# Patient Record
Sex: Male | Born: 1960 | Race: Black or African American | Hispanic: No | Marital: Married | Smoking: Current every day smoker
Health system: Southern US, Community
[De-identification: ages and names within clinical notes are randomized; demographics above are authoritative.]

## PROBLEM LIST (undated history)

## (undated) DIAGNOSIS — F329 Major depressive disorder, single episode, unspecified: Secondary | ICD-10-CM

## (undated) DIAGNOSIS — J45909 Unspecified asthma, uncomplicated: Secondary | ICD-10-CM

## (undated) DIAGNOSIS — I10 Essential (primary) hypertension: Secondary | ICD-10-CM

## (undated) DIAGNOSIS — M549 Dorsalgia, unspecified: Secondary | ICD-10-CM

## (undated) DIAGNOSIS — F32A Depression, unspecified: Secondary | ICD-10-CM

## (undated) HISTORY — PX: HAND TENDON SURGERY: SHX663

---

## 2009-09-17 ENCOUNTER — Encounter
Admission: RE | Admit: 2009-09-17 | Discharge: 2009-12-10 | Payer: Self-pay | Admitting: Physical Medicine & Rehabilitation

## 2009-11-19 ENCOUNTER — Ambulatory Visit: Payer: Self-pay | Admitting: Physical Medicine and Rehabilitation

## 2009-12-10 ENCOUNTER — Encounter
Admission: RE | Admit: 2009-12-10 | Discharge: 2010-03-10 | Payer: Self-pay | Admitting: Physical Medicine and Rehabilitation

## 2009-12-17 ENCOUNTER — Emergency Department (HOSPITAL_COMMUNITY): Admission: EM | Admit: 2009-12-17 | Discharge: 2009-12-17 | Payer: Self-pay | Admitting: Emergency Medicine

## 2009-12-17 ENCOUNTER — Ambulatory Visit (HOSPITAL_COMMUNITY)
Admission: RE | Admit: 2009-12-17 | Discharge: 2009-12-17 | Payer: Self-pay | Admitting: Physical Medicine and Rehabilitation

## 2009-12-24 ENCOUNTER — Ambulatory Visit: Payer: Self-pay | Admitting: Physical Medicine and Rehabilitation

## 2010-07-15 LAB — GC/CHLAMYDIA PROBE AMP, GENITAL
Chlamydia, DNA Probe: NEGATIVE
GC Probe Amp, Genital: NEGATIVE

## 2010-08-15 ENCOUNTER — Emergency Department (HOSPITAL_COMMUNITY)
Admission: EM | Admit: 2010-08-15 | Discharge: 2010-08-15 | Disposition: A | Discharge status: Home / Self Care | Payer: Medicaid Other | Attending: Emergency Medicine | Admitting: Emergency Medicine

## 2010-08-15 DIAGNOSIS — M549 Dorsalgia, unspecified: Secondary | ICD-10-CM | POA: Insufficient documentation

## 2010-08-15 DIAGNOSIS — M79609 Pain in unspecified limb: Secondary | ICD-10-CM | POA: Insufficient documentation

## 2010-08-15 DIAGNOSIS — G8929 Other chronic pain: Secondary | ICD-10-CM | POA: Insufficient documentation

## 2010-08-26 ENCOUNTER — Emergency Department (HOSPITAL_COMMUNITY)
Admission: EM | Admit: 2010-08-26 | Discharge: 2010-08-26 | Disposition: A | Discharge status: Home / Self Care | Payer: Medicaid Other | Attending: Emergency Medicine | Admitting: Emergency Medicine

## 2010-08-26 ENCOUNTER — Emergency Department (HOSPITAL_COMMUNITY): Payer: Medicaid Other

## 2010-08-26 DIAGNOSIS — M549 Dorsalgia, unspecified: Secondary | ICD-10-CM | POA: Insufficient documentation

## 2010-08-26 DIAGNOSIS — M79609 Pain in unspecified limb: Secondary | ICD-10-CM | POA: Insufficient documentation

## 2010-08-26 DIAGNOSIS — Z79899 Other long term (current) drug therapy: Secondary | ICD-10-CM | POA: Insufficient documentation

## 2010-08-26 DIAGNOSIS — M25649 Stiffness of unspecified hand, not elsewhere classified: Secondary | ICD-10-CM | POA: Insufficient documentation

## 2010-08-26 DIAGNOSIS — G8929 Other chronic pain: Secondary | ICD-10-CM | POA: Insufficient documentation

## 2012-02-13 ENCOUNTER — Emergency Department (HOSPITAL_COMMUNITY)
Admission: EM | Admit: 2012-02-13 | Discharge: 2012-02-13 | Disposition: A | Discharge status: Home / Self Care | Payer: Medicaid Other | Attending: Emergency Medicine | Admitting: Emergency Medicine

## 2012-02-13 ENCOUNTER — Encounter (HOSPITAL_COMMUNITY): Payer: Self-pay

## 2012-02-13 DIAGNOSIS — Z760 Encounter for issue of repeat prescription: Secondary | ICD-10-CM

## 2012-02-13 DIAGNOSIS — F17200 Nicotine dependence, unspecified, uncomplicated: Secondary | ICD-10-CM | POA: Insufficient documentation

## 2012-02-13 DIAGNOSIS — F329 Major depressive disorder, single episode, unspecified: Secondary | ICD-10-CM | POA: Insufficient documentation

## 2012-02-13 DIAGNOSIS — F3289 Other specified depressive episodes: Secondary | ICD-10-CM | POA: Insufficient documentation

## 2012-02-13 DIAGNOSIS — J45909 Unspecified asthma, uncomplicated: Secondary | ICD-10-CM | POA: Insufficient documentation

## 2012-02-13 DIAGNOSIS — I10 Essential (primary) hypertension: Secondary | ICD-10-CM | POA: Insufficient documentation

## 2012-02-13 DIAGNOSIS — G8929 Other chronic pain: Secondary | ICD-10-CM | POA: Insufficient documentation

## 2012-02-13 HISTORY — DX: Dorsalgia, unspecified: M54.9

## 2012-02-13 HISTORY — DX: Depression, unspecified: F32.A

## 2012-02-13 HISTORY — DX: Unspecified asthma, uncomplicated: J45.909

## 2012-02-13 HISTORY — DX: Major depressive disorder, single episode, unspecified: F32.9

## 2012-02-13 HISTORY — DX: Essential (primary) hypertension: I10

## 2012-02-13 MED ORDER — OXYCODONE-ACETAMINOPHEN 5-325 MG PO TABS
1.0000 | ORAL_TABLET | Freq: Once | ORAL | Status: AC
Start: 2012-02-13 — End: 2012-02-13
  Administered 2012-02-13: 1 via ORAL
  Filled 2012-02-13: qty 1

## 2012-02-13 MED ORDER — AMLODIPINE BESYLATE 5 MG PO TABS: 5.0000 mg | ORAL_TABLET | Freq: Every day | ORAL | Status: DC
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
## 2012-02-13 NOTE — ED Provider Notes (Signed)
History     CSN: [id number]  Arrival date & time 02/13/12  1406   First MD Initiated Contact with Patient 02/13/12 1439      Chief Complaint  Patient presents with  . Medication Refill    (Consider location/radiation/quality/duration/timing/severity/associated sxs/prior treatment) HPI Comments: Patient presents requesting refill of chronic pain medication and HTM medication. Patient states that he recently moved from [place] and is out of these medications. He states that he has not seen a doctor since moving back. He was on percocet but cannot remember HTN meds. No vision change, stroke sx, chest pain, SOB. Upper extremity pain at baseline. Review of [place] substance reporting database shows RX 30 Percocet by Dr. Concepcion Elk < 30 days ago. When asked about this, patient then states he has seen Dr. Concepcion Elk last month and prior to this. He states he is unable to see Dr. Ginette Otto until the end of the months and asks for #20 Percocet 'to get him through'. No other complaints.   The history is provided by the patient.    Past Medical History  Diagnosis Date  . Hypertension   . Depression   . Back pain   . Asthma     Past Surgical History  Procedure Date  . Hand tendon surgery     No family history on file.  History  Substance Use Topics  . Smoking status: Current Every Day Smoker  . Smokeless tobacco: Not on file  . Alcohol Use: No      Review of Systems  Constitutional: Negative for fever.  HENT: Negative for sore throat and rhinorrhea.   Eyes: Negative for visual disturbance.  Respiratory: Negative for cough.   Cardiovascular: Negative for chest pain.  Gastrointestinal: Negative for nausea, vomiting, abdominal pain and diarrhea.  Genitourinary: Negative for dysuria.  Musculoskeletal: Positive for arthralgias. Negative for myalgias.  Skin: Negative for rash.  Neurological: Negative for weakness, numbness and headaches.    Allergies  Nsaids  Home Medications    Current Outpatient Rx  Name Route Sig Dispense Refill  . PRESCRIPTION MEDICATION Oral Take 1 tablet by mouth daily. Pt's on a blood pressure medication. Pt doesn't know the name of this medication. Pt has not taken in the last 2 months    . QUETIAPINE FUMARATE 300 MG PO TABS Oral Take 300 mg by mouth at bedtime.      BP 138/88  Pulse 73  Temp 98.5 F (36.9 C) (Oral)  Resp 18  Ht 5\' 7"  (1.702 m)  Wt 155 lb (70.308 kg)  BMI 24.28 kg/m2  SpO2 100%  Physical Exam  Nursing note and vitals reviewed. Constitutional: He appears well-developed and well-nourished.  HENT:  Head: Normocephalic and atraumatic.  Eyes: Conjunctivae normal are normal. Right eye exhibits no discharge. Left eye exhibits no discharge.  Neck: Normal range of motion. Neck supple.  Cardiovascular: Normal rate, regular rhythm and normal heart sounds.   Pulmonary/Chest: Effort normal and breath sounds normal.  Abdominal: Soft. There is no tenderness.  Musculoskeletal:       Healed surgical scars R wrist.   Neurological: He is alert.  Skin: Skin is warm and dry.  Psychiatric: He has a normal mood and affect.    ED Course  Procedures (including critical care time)  Labs Reviewed - No data to display No results found.   1. Chronic pain   2. Medication refill     2:53 PM Patient seen and examined. Will give pain medicine here. Discussed that chronic pain  cannot be managed in ED and deferred patient to PCP. Will start patient on low dose amlodipine. Patient urged to f/u with PCP as planned.   Vital signs reviewed and are as follows: Filed Vitals:   02/13/12 1426  BP: 138/88  Pulse: 73  Temp: 98.5 F (36.9 C)  Resp: 18      MDM  Chronic pain - one dose here. Patient needs to follow-up with his PCP for management of chronic pain. Patient was not immediately forthcoming about pain medications received from Dr. Concepcion Elk last month.   HTN - patient does not know what he has been taking. Will start on  5mg  amlodipine and allow PCP to adjust as desired.         Renne Crigler, [place] 02/13/12 [phone]

## 2012-02-15 NOTE — ED Provider Notes (Signed)
Medical screening examination/treatment/procedure(s) were performed by non-physician practitioner and as supervising physician I was immediately available for consultation/collaboration.  Casmira Cramer R. Tyshon Fanning, MD 02/15/12 2356 

## 2012-03-18 ENCOUNTER — Encounter (HOSPITAL_COMMUNITY): Payer: Self-pay | Admitting: Emergency Medicine

## 2012-03-18 ENCOUNTER — Emergency Department (HOSPITAL_COMMUNITY): Payer: Medicaid Other

## 2012-03-18 ENCOUNTER — Emergency Department (HOSPITAL_COMMUNITY)
Admission: EM | Admit: 2012-03-18 | Discharge: 2012-03-18 | Disposition: A | Discharge status: Home / Self Care | Payer: Medicaid Other | Attending: Emergency Medicine | Admitting: Emergency Medicine

## 2012-03-18 DIAGNOSIS — F17200 Nicotine dependence, unspecified, uncomplicated: Secondary | ICD-10-CM | POA: Insufficient documentation

## 2012-03-18 DIAGNOSIS — I10 Essential (primary) hypertension: Secondary | ICD-10-CM | POA: Insufficient documentation

## 2012-03-18 DIAGNOSIS — Z8739 Personal history of other diseases of the musculoskeletal system and connective tissue: Secondary | ICD-10-CM | POA: Insufficient documentation

## 2012-03-18 DIAGNOSIS — F329 Major depressive disorder, single episode, unspecified: Secondary | ICD-10-CM | POA: Insufficient documentation

## 2012-03-18 DIAGNOSIS — Z79899 Other long term (current) drug therapy: Secondary | ICD-10-CM | POA: Insufficient documentation

## 2012-03-18 DIAGNOSIS — Z23 Encounter for immunization: Secondary | ICD-10-CM | POA: Insufficient documentation

## 2012-03-18 DIAGNOSIS — Y929 Unspecified place or not applicable: Secondary | ICD-10-CM | POA: Insufficient documentation

## 2012-03-18 DIAGNOSIS — S0180XA Unspecified open wound of other part of head, initial encounter: Secondary | ICD-10-CM | POA: Insufficient documentation

## 2012-03-18 DIAGNOSIS — Y939 Activity, unspecified: Secondary | ICD-10-CM | POA: Insufficient documentation

## 2012-03-18 DIAGNOSIS — J45909 Unspecified asthma, uncomplicated: Secondary | ICD-10-CM | POA: Insufficient documentation

## 2012-03-18 DIAGNOSIS — IMO0002 Reserved for concepts with insufficient information to code with codable children: Secondary | ICD-10-CM | POA: Insufficient documentation

## 2012-03-18 DIAGNOSIS — F3289 Other specified depressive episodes: Secondary | ICD-10-CM | POA: Insufficient documentation

## 2012-03-18 DIAGNOSIS — S0181XA Laceration without foreign body of other part of head, initial encounter: Secondary | ICD-10-CM

## 2012-03-18 MED ORDER — TETANUS-DIPHTH-ACELL PERTUSSIS 5-2.5-18.5 LF-MCG/0.5 IM SUSP
0.5000 mL | Freq: Once | INTRAMUSCULAR | Status: AC
  Administered 2012-03-18: 0.5 mL via INTRAMUSCULAR
  Filled 2012-03-18: qty 0.5

## 2012-03-18 MED ORDER — HYDROCODONE-ACETAMINOPHEN 5-325 MG PO TABS: 1.0000 | ORAL_TABLET | Freq: Four times a day (QID) | ORAL | Status: DC | PRN

## 2012-03-18 MED ORDER — HYDROCODONE-ACETAMINOPHEN 5-325 MG PO TABS
1.0000 | ORAL_TABLET | Freq: Once | ORAL | Status: AC
  Administered 2012-03-18: 1 via ORAL
  Filled 2012-03-18: qty 1
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
## 2012-03-18 NOTE — ED Provider Notes (Signed)
Medical screening examination/treatment/procedure(s) were performed by non-physician practitioner and as supervising physician I was immediately available for consultation/collaboration.   Celene Kras, MD 03/18/12 [phone]
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
## 2012-04-02 ENCOUNTER — Encounter (HOSPITAL_COMMUNITY): Payer: Self-pay | Admitting: Neurology

## 2012-04-02 ENCOUNTER — Emergency Department (HOSPITAL_COMMUNITY)
Admission: EM | Admit: 2012-04-02 | Discharge: 2012-04-02 | Disposition: A | Discharge status: Home / Self Care | Payer: Medicaid Other | Attending: Emergency Medicine | Admitting: Emergency Medicine

## 2012-04-02 DIAGNOSIS — M79609 Pain in unspecified limb: Secondary | ICD-10-CM

## 2012-04-02 DIAGNOSIS — F17200 Nicotine dependence, unspecified, uncomplicated: Secondary | ICD-10-CM | POA: Insufficient documentation

## 2012-04-02 DIAGNOSIS — M79606 Pain in leg, unspecified: Secondary | ICD-10-CM

## 2012-04-02 DIAGNOSIS — F3289 Other specified depressive episodes: Secondary | ICD-10-CM | POA: Insufficient documentation

## 2012-04-02 DIAGNOSIS — R197 Diarrhea, unspecified: Secondary | ICD-10-CM | POA: Insufficient documentation

## 2012-04-02 DIAGNOSIS — R61 Generalized hyperhidrosis: Secondary | ICD-10-CM | POA: Insufficient documentation

## 2012-04-02 DIAGNOSIS — F329 Major depressive disorder, single episode, unspecified: Secondary | ICD-10-CM | POA: Insufficient documentation

## 2012-04-02 DIAGNOSIS — M25569 Pain in unspecified knee: Secondary | ICD-10-CM | POA: Insufficient documentation

## 2012-04-02 DIAGNOSIS — Z79899 Other long term (current) drug therapy: Secondary | ICD-10-CM | POA: Insufficient documentation

## 2012-04-02 DIAGNOSIS — J45909 Unspecified asthma, uncomplicated: Secondary | ICD-10-CM | POA: Insufficient documentation

## 2012-04-02 DIAGNOSIS — I10 Essential (primary) hypertension: Secondary | ICD-10-CM | POA: Insufficient documentation

## 2012-04-02 LAB — CBC WITH DIFFERENTIAL/PLATELET
Basophils Absolute: 0.1 10*3/uL (ref 0.0–0.1)
Eosinophils Absolute: 0.6 10*3/uL (ref 0.0–0.7)
Eosinophils Relative: 7 % — ABNORMAL HIGH (ref 0–5)
HCT: 45.9 % (ref 39.0–52.0)
Hemoglobin: 15.8 g/dL (ref 13.0–17.0)
MCH: 31 pg (ref 26.0–34.0)
MCHC: 34.4 g/dL (ref 30.0–36.0)
Monocytes Absolute: 0.6 10*3/uL (ref 0.1–1.0)
Monocytes Relative: 7 % (ref 3–12)
Neutro Abs: 3.5 10*3/uL (ref 1.7–7.7)
RBC: 5.1 MIL/uL (ref 4.22–5.81)
RDW: 14 % (ref 11.5–15.5)
WBC: 7.8 10*3/uL (ref 4.0–10.5)

## 2012-04-02 LAB — URINALYSIS, ROUTINE W REFLEX MICROSCOPIC
Bilirubin Urine: NEGATIVE
Leukocytes, UA: NEGATIVE
Protein, ur: NEGATIVE mg/dL

## 2012-04-02 LAB — BASIC METABOLIC PANEL
GFR calc non Af Amer: 85 mL/min — ABNORMAL LOW (ref >90)
Glucose, Bld: 96 mg/dL (ref 70–99)
Sodium: 138 mEq/L (ref 135–145)

## 2012-04-02 LAB — TROPONIN I: Troponin I: <0.3 ng/mL (ref <0.30)

## 2012-04-02 LAB — MAGNESIUM: Magnesium: 2.1 mg/dL (ref 1.5–2.5)

## 2012-04-02 MED ORDER — HYDROCODONE-ACETAMINOPHEN 5-325 MG PO TABS
1.0000 | ORAL_TABLET | Freq: Once | ORAL | Status: DC
  Filled 2012-04-02: qty 1

## 2012-04-02 MED ORDER — SODIUM CHLORIDE 0.9 % IV BOLUS (SEPSIS)
1000.0000 mL | Freq: Once | INTRAVENOUS | Status: AC
  Administered 2012-04-02: 1000 mL via INTRAVENOUS

## 2012-04-02 MED ORDER — OXYCODONE-ACETAMINOPHEN 5-325 MG PO TABS
2.0000 | ORAL_TABLET | Freq: Once | ORAL | Status: AC
  Administered 2012-04-02: 2 via ORAL
  Filled 2012-04-02: qty 2

## 2012-04-02 MED ORDER — HYDROCODONE-ACETAMINOPHEN 5-325 MG PO TABS: 2.0000 | ORAL_TABLET | ORAL | Status: DC | PRN

## 2012-04-02 MED ORDER — OXYCODONE-ACETAMINOPHEN 5-325 MG PO TABS: 2.0000 | ORAL_TABLET | Freq: Four times a day (QID) | ORAL | Status: DC | PRN

## 2012-04-02 NOTE — ED Notes (Signed)
Pt reporting can't sleep, breaking out in cold sweats x 1 week. Diarrhea no vomiting. Pt c/o right knee pain, has brace in place. Is out of percocet rx. Pt is ambulatory. Pt a x 4.

## 2012-04-02 NOTE — ED Notes (Signed)
Pt unable to provide urine sample at this time 
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
## 2012-04-02 NOTE — ED Notes (Signed)
Pt provided bus pass 

## 2012-04-02 NOTE — Progress Notes (Signed)
VASCULAR LAB PRELIMINARY  PRELIMINARY  PRELIMINARY  PRELIMINARY  Right lower extremity venous duplex completed.    Preliminary report:  Right:  No evidence of DVT, superficial thrombosis, or Baker's cyst.  Xavier [name], RVS 04/02/2012, 9:08 AM
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
## 2012-07-23 ENCOUNTER — Encounter (HOSPITAL_COMMUNITY): Payer: Self-pay | Admitting: Emergency Medicine

## 2012-07-23 ENCOUNTER — Emergency Department (HOSPITAL_COMMUNITY)
Admission: EM | Admit: 2012-07-23 | Discharge: 2012-07-23 | Disposition: A | Discharge status: Home / Self Care | Payer: Medicaid Other | Attending: Emergency Medicine | Admitting: Emergency Medicine

## 2012-07-23 DIAGNOSIS — F17200 Nicotine dependence, unspecified, uncomplicated: Secondary | ICD-10-CM | POA: Insufficient documentation

## 2012-07-23 DIAGNOSIS — Z23 Encounter for immunization: Secondary | ICD-10-CM | POA: Insufficient documentation

## 2012-07-23 DIAGNOSIS — IMO0002 Reserved for concepts with insufficient information to code with codable children: Secondary | ICD-10-CM | POA: Insufficient documentation

## 2012-07-23 DIAGNOSIS — J45909 Unspecified asthma, uncomplicated: Secondary | ICD-10-CM | POA: Insufficient documentation

## 2012-07-23 DIAGNOSIS — G8929 Other chronic pain: Secondary | ICD-10-CM | POA: Insufficient documentation

## 2012-07-23 DIAGNOSIS — Z79899 Other long term (current) drug therapy: Secondary | ICD-10-CM | POA: Insufficient documentation

## 2012-07-23 DIAGNOSIS — I10 Essential (primary) hypertension: Secondary | ICD-10-CM | POA: Insufficient documentation

## 2012-07-23 DIAGNOSIS — Z8659 Personal history of other mental and behavioral disorders: Secondary | ICD-10-CM | POA: Insufficient documentation

## 2012-07-23 DIAGNOSIS — Z760 Encounter for issue of repeat prescription: Secondary | ICD-10-CM | POA: Insufficient documentation

## 2012-07-23 DIAGNOSIS — Y939 Activity, unspecified: Secondary | ICD-10-CM | POA: Insufficient documentation

## 2012-07-23 DIAGNOSIS — W108XXA Fall (on) (from) other stairs and steps, initial encounter: Secondary | ICD-10-CM | POA: Insufficient documentation

## 2012-07-23 DIAGNOSIS — Y929 Unspecified place or not applicable: Secondary | ICD-10-CM | POA: Insufficient documentation

## 2012-07-23 DIAGNOSIS — M25569 Pain in unspecified knee: Secondary | ICD-10-CM | POA: Insufficient documentation

## 2012-07-23 DIAGNOSIS — Z8739 Personal history of other diseases of the musculoskeletal system and connective tissue: Secondary | ICD-10-CM | POA: Insufficient documentation

## 2012-07-23 MED ORDER — TETANUS-DIPHTH-ACELL PERTUSSIS 5-2.5-18.5 LF-MCG/0.5 IM SUSP
0.5000 mL | Freq: Once | INTRAMUSCULAR | Status: AC
  Administered 2012-07-23: 0.5 mL via INTRAMUSCULAR
  Filled 2012-07-23: qty 0.5

## 2012-07-23 MED ORDER — OXYCODONE-ACETAMINOPHEN 5-325 MG PO TABS
1.0000 | ORAL_TABLET | Freq: Once | ORAL | Status: AC
  Administered 2012-07-23: 1 via ORAL
  Filled 2012-07-23: qty 1

## 2012-07-23 NOTE — ED Provider Notes (Signed)
History     This chart was scribed for non-physician practitioner, Trixie Dredge working with Nelia Shi, MD by Smitty Pluck, ED scribe. This patient was seen in room TR07C/TR07C and the patient's care was started at 5:03 PM.  CSN: [id number]  Arrival date & time 07/23/12  1413      Chief Complaint  Patient presents with  . Medication Refill     The history is provided by the patient. No language interpreter was used.   Xavier [name] is a 52 y.o. male who presents to the Emergency Department requesting Seroquel and percocet. Pt mentions that he has not had his medications for the past 2 months. He states that he has constant, moderate, chronic left knee pain that is worse at night. Pain is rated at 9/10 currently. He states he has had the knee pain for years but he fell backwards last week down steps. He denies LOC. He states that he usually goes to pain management clinic for percocet but they could not see him until 2 weeks. He states the he saw Dr. Concepcion Elk recently and was not able to get pain medication because he is under contact with the pain clinic. Pt denies SI, HI, fever, chills, nausea, vomiting, diarrhea, weakness, cough, SOB and any other pain. He is unsure if tetanus is UTD.    Past Medical History  Diagnosis Date  . Hypertension   . Depression   . Back pain   . Asthma     Past Surgical History  Procedure Laterality Date  . Hand tendon surgery      History reviewed. No pertinent family history.  History  Substance Use Topics  . Smoking status: Current Every Day Smoker  . Smokeless tobacco: Not on file  . Alcohol Use: No      Review of Systems  Constitutional: Negative for fever and chills.  Respiratory: Negative for cough and shortness of breath.   Gastrointestinal: Negative for nausea, vomiting and diarrhea.  Musculoskeletal: Positive for arthralgias.  Neurological: Negative for weakness.    Allergies  Nsaids  Home Medications   Current  Outpatient Rx  Name  Route  Sig  Dispense  Refill  . albuterol (PROVENTIL HFA;VENTOLIN HFA) 108 (90 BASE) MCG/ACT inhaler   Inhalation   Inhale 2 puffs into the lungs every 6 (six) hours as needed for wheezing.         Marland Kitchen QUEtiapine (SEROQUEL) 300 MG tablet   Oral   Take 300 mg by mouth 2 (two) times daily.            BP 132/76  Pulse 78  Temp(Src) 98.1 F (36.7 C) (Oral)  Resp 16  SpO2 98%  Physical Exam  Nursing note and vitals reviewed. Constitutional: He appears well-developed and well-nourished. No distress.  HENT:  Head: Normocephalic.    Neck: Neck supple.  Pulmonary/Chest: Effort normal. No respiratory distress.  Musculoskeletal: Normal range of motion. He exhibits no edema.       Right knee: He exhibits normal range of motion, no swelling, no effusion, no ecchymosis, no deformity, no laceration, no LCL laxity and no MCL laxity.       Left knee: He exhibits normal range of motion, no swelling, no effusion, no ecchymosis, no deformity, normal alignment, no LCL laxity and no MCL laxity.  Distal sensation intact Distal pulses intact  Spine nontender without crepitus or stepoffs.   Neurological: He is alert. He has normal strength. No sensory deficit. GCS eye subscore is  4. GCS verbal subscore is 5. GCS motor subscore is 6.  Skin: Skin is warm and dry. He is not diaphoretic.    ED Course  Procedures (including critical care time) DIAGNOSTIC STUDIES: Oxygen Saturation is 98% on room air, normal by my interpretation.    COORDINATION OF CARE: 5:08 PM Discussed ED treatment with pt and pt agrees.     Labs Reviewed - No data to display No results found.   1. Medication refill   2. Chronic knee pain, right      MDM  Pt with chronic pain that is unchanged presented to ED requesting pain medication prescription, also prescription for seroquel which he states he has been off for 3-4 months.  I do not feel that it is appropriate for me to write these  prescriptions.  Pt has already been seen by his PCP who apparently did not write the prescriptions either.  Pt has follow up with pain management in a few weeks.  Knee exam is unremarkable.  No new or changed symptoms to chronic pain.  No need for imaging at this time.  Pt given 1 percocet PO and d/c home with resources, PCP and pain management follow up.   Small abrasion on face.  No neuro complaints, no gross deficits.  Fall was last week.  Tetanus updated.   I doubt any other EMC precluding discharge at this time including, but not necessarily limited to the following: septic knee, SI.     I personally performed the services described in this documentation, which was scribed in my presence. The recorded information has been reviewed and is accurate.        Trixie Dredge, PA-C 07/23/12 2121  Trixie Dredge, PA-C 07/23/12 2123

## 2012-07-23 NOTE — ED Notes (Signed)
Pt here requesting refill of psych meds and percocet; pt c/o chronic knee pain

## 2012-07-24 NOTE — ED Provider Notes (Signed)
Medical screening examination/treatment/procedure(s) were performed by non-physician practitioner and as supervising physician I was immediately available for consultation/collaboration.   Nelia Shi, MD 07/24/12 2242

## 2012-12-10 ENCOUNTER — Inpatient Hospital Stay (HOSPITAL_COMMUNITY)
Admission: EM | Admit: 2012-12-10 | Discharge: 2012-12-12 | DRG: 917 | Disposition: A | Discharge status: Home / Self Care | Payer: Medicaid Other | Attending: Internal Medicine | Admitting: Internal Medicine

## 2012-12-10 ENCOUNTER — Emergency Department (HOSPITAL_COMMUNITY): Payer: Medicaid Other

## 2012-12-10 ENCOUNTER — Inpatient Hospital Stay (HOSPITAL_COMMUNITY): Payer: Medicaid Other

## 2012-12-10 ENCOUNTER — Encounter (HOSPITAL_COMMUNITY): Payer: Self-pay | Admitting: Emergency Medicine

## 2012-12-10 DIAGNOSIS — Z5900 Homelessness unspecified: Secondary | ICD-10-CM

## 2012-12-10 DIAGNOSIS — Z9119 Patient's noncompliance with other medical treatment and regimen: Secondary | ICD-10-CM

## 2012-12-10 DIAGNOSIS — R4189 Other symptoms and signs involving cognitive functions and awareness: Secondary | ICD-10-CM

## 2012-12-10 DIAGNOSIS — T4271XA Poisoning by unspecified antiepileptic and sedative-hypnotic drugs, accidental (unintentional), initial encounter: Secondary | ICD-10-CM

## 2012-12-10 DIAGNOSIS — T65891A Toxic effect of other specified substances, accidental (unintentional), initial encounter: Secondary | ICD-10-CM | POA: Diagnosis present

## 2012-12-10 DIAGNOSIS — E162 Hypoglycemia, unspecified: Secondary | ICD-10-CM | POA: Diagnosis not present

## 2012-12-10 DIAGNOSIS — G894 Chronic pain syndrome: Secondary | ICD-10-CM | POA: Diagnosis present

## 2012-12-10 DIAGNOSIS — Z79899 Other long term (current) drug therapy: Secondary | ICD-10-CM

## 2012-12-10 DIAGNOSIS — Z91199 Patient's noncompliance with other medical treatment and regimen due to unspecified reason: Secondary | ICD-10-CM

## 2012-12-10 DIAGNOSIS — F10229 Alcohol dependence with intoxication, unspecified: Secondary | ICD-10-CM | POA: Diagnosis present

## 2012-12-10 DIAGNOSIS — F17200 Nicotine dependence, unspecified, uncomplicated: Secondary | ICD-10-CM | POA: Diagnosis present

## 2012-12-10 DIAGNOSIS — T510X4A Toxic effect of ethanol, undetermined, initial encounter: Principal | ICD-10-CM | POA: Diagnosis present

## 2012-12-10 DIAGNOSIS — F319 Bipolar disorder, unspecified: Secondary | ICD-10-CM

## 2012-12-10 DIAGNOSIS — J9600 Acute respiratory failure, unspecified whether with hypoxia or hypercapnia: Secondary | ICD-10-CM

## 2012-12-10 DIAGNOSIS — R404 Transient alteration of awareness: Secondary | ICD-10-CM

## 2012-12-10 DIAGNOSIS — F1020 Alcohol dependence, uncomplicated: Secondary | ICD-10-CM

## 2012-12-10 DIAGNOSIS — T50901A Poisoning by unspecified drugs, medicaments and biological substances, accidental (unintentional), initial encounter: Secondary | ICD-10-CM

## 2012-12-10 DIAGNOSIS — G9341 Metabolic encephalopathy: Secondary | ICD-10-CM | POA: Diagnosis present

## 2012-12-10 DIAGNOSIS — T426X4A Poisoning by other antiepileptic and sedative-hypnotic drugs, undetermined, initial encounter: Secondary | ICD-10-CM

## 2012-12-10 DIAGNOSIS — I10 Essential (primary) hypertension: Secondary | ICD-10-CM

## 2012-12-10 DIAGNOSIS — T40601A Poisoning by unspecified narcotics, accidental (unintentional), initial encounter: Secondary | ICD-10-CM

## 2012-12-10 DIAGNOSIS — E878 Other disorders of electrolyte and fluid balance, not elsewhere classified: Secondary | ICD-10-CM | POA: Diagnosis not present

## 2012-12-10 DIAGNOSIS — F1110 Opioid abuse, uncomplicated: Secondary | ICD-10-CM | POA: Diagnosis present

## 2012-12-10 LAB — LACTIC ACID, PLASMA: Lactic Acid, Venous: 1.9 mmol/L (ref 0.5–2.2)

## 2012-12-10 LAB — MAGNESIUM: Magnesium: 2 mg/dL (ref 1.5–2.5)

## 2012-12-10 LAB — RAPID URINE DRUG SCREEN, HOSP PERFORMED
Barbiturates: NOT DETECTED
Tetrahydrocannabinol: NOT DETECTED

## 2012-12-10 LAB — COMPREHENSIVE METABOLIC PANEL
BUN: 10 mg/dL (ref 6–23)
CO2: 22 mEq/L (ref 19–32)
Calcium: 8.3 mg/dL — ABNORMAL LOW (ref 8.4–10.5)
Chloride: 108 mEq/L (ref 96–112)
Creatinine, Ser: 0.92 mg/dL (ref 0.50–1.35)
GFR calc Af Amer: >90 mL/min (ref >90)
GFR calc non Af Amer: >90 mL/min (ref >90)
Glucose, Bld: 100 mg/dL — ABNORMAL HIGH (ref 70–99)
Total Bilirubin: 0.5 mg/dL (ref 0.3–1.2)

## 2012-12-10 LAB — CBC WITH DIFFERENTIAL/PLATELET
Eosinophils Relative: 4 % (ref 0–5)
HCT: 42.3 % (ref 39.0–52.0)
Hemoglobin: 15.1 g/dL (ref 13.0–17.0)
Lymphocytes Relative: 62 % — ABNORMAL HIGH (ref 12–46)
MCV: 87.6 fL (ref 78.0–100.0)
Monocytes Absolute: 0.4 10*3/uL (ref 0.1–1.0)
Monocytes Relative: 7 % (ref 3–12)
Neutro Abs: 1.5 10*3/uL — ABNORMAL LOW (ref 1.7–7.7)
RDW: 13.1 % (ref 11.5–15.5)
WBC: 5.8 10*3/uL (ref 4.0–10.5)

## 2012-12-10 LAB — CBC
HCT: 42.5 % (ref 39.0–52.0)
Hemoglobin: 14.6 g/dL (ref 13.0–17.0)
MCH: 30.2 pg (ref 26.0–34.0)
MCHC: 34.4 g/dL (ref 30.0–36.0)
MCV: 88 fL (ref 78.0–100.0)

## 2012-12-10 LAB — URINALYSIS, ROUTINE W REFLEX MICROSCOPIC
Bilirubin Urine: NEGATIVE
Nitrite: NEGATIVE
Specific Gravity, Urine: 1.012 (ref 1.005–1.030)
Urobilinogen, UA: 0.2 mg/dL (ref 0.0–1.0)

## 2012-12-10 LAB — SALICYLATE LEVEL: Salicylate Lvl: <2 mg/dL — ABNORMAL LOW (ref 2.8–20.0)

## 2012-12-10 LAB — PROTIME-INR
INR: 1.12 (ref 0.00–1.49)
Prothrombin Time: 14.2 seconds (ref 11.6–15.2)

## 2012-12-10 LAB — BASIC METABOLIC PANEL
Chloride: 109 mEq/L (ref 96–112)
GFR calc Af Amer: >90 mL/min (ref >90)
Potassium: 4 mEq/L (ref 3.5–5.1)
Sodium: 143 mEq/L (ref 135–145)

## 2012-12-10 LAB — POCT I-STAT 3, ART BLOOD GAS (G3+)
Acid-base deficit: 1 mmol/L (ref 0.0–2.0)
O2 Saturation: 100 %
TCO2: 25 mmol/L (ref 0–100)
pO2, Arterial: 322 mmHg — ABNORMAL HIGH (ref 80.0–100.0)

## 2012-12-10 LAB — ETHANOL: Alcohol, Ethyl (B): 272 mg/dL — ABNORMAL HIGH (ref 0–11)

## 2012-12-10 MED ORDER — INSULIN ASPART 100 UNIT/ML ~~LOC~~ SOLN: 2.0000 [IU] | SUBCUTANEOUS | Status: DC

## 2012-12-10 MED ORDER — ETOMIDATE 2 MG/ML IV SOLN
INTRAVENOUS | Status: AC
  Filled 2012-12-10: qty 20

## 2012-12-10 MED ORDER — NALOXONE HCL 0.4 MG/ML IJ SOLN
INTRAMUSCULAR | Status: AC
  Administered 2012-12-10: 0.4 mg
  Filled 2012-12-10: qty 1

## 2012-12-10 MED ORDER — ETOMIDATE 2 MG/ML IV SOLN
25.0000 mg | Freq: Once | INTRAVENOUS | Status: AC
  Administered 2012-12-10: 25 mg via INTRAVENOUS

## 2012-12-10 MED ORDER — PROPOFOL 10 MG/ML IV EMUL
INTRAVENOUS | Status: AC
  Administered 2012-12-10: 1000 mg via INTRAVENOUS
  Filled 2012-12-10: qty 100

## 2012-12-10 MED ORDER — ONDANSETRON HCL 4 MG/2ML IJ SOLN
INTRAMUSCULAR | Status: AC
  Filled 2012-12-10: qty 2

## 2012-12-10 MED ORDER — SODIUM CHLORIDE 0.9 % IV BOLUS (SEPSIS)
1000.0000 mL | Freq: Once | INTRAVENOUS | Status: AC
  Administered 2012-12-10: 1000 mL via INTRAVENOUS

## 2012-12-10 MED ORDER — DEXTROSE-NACL 5-0.45 % IV SOLN
INTRAVENOUS | Status: DC
  Administered 2012-12-10: via INTRAVENOUS

## 2012-12-10 MED ORDER — SUCCINYLCHOLINE CHLORIDE 20 MG/ML IJ SOLN
100.0000 mg | Freq: Once | INTRAMUSCULAR | Status: AC
  Administered 2012-12-10: 100 mg via INTRAVENOUS

## 2012-12-10 MED ORDER — FENTANYL CITRATE 0.05 MG/ML IJ SOLN
50.0000 ug | Freq: Once | INTRAMUSCULAR | Status: AC
  Administered 2012-12-10: 50 ug via INTRAVENOUS
  Filled 2012-12-10: qty 2

## 2012-12-10 MED ORDER — DEXTROSE 50 % IV SOLN
1.0000 | Freq: Once | INTRAVENOUS | Status: AC
  Administered 2012-12-10: 50 mL via INTRAVENOUS

## 2012-12-10 MED ORDER — PANTOPRAZOLE SODIUM 40 MG IV SOLR
40.0000 mg | INTRAVENOUS | Status: DC
  Administered 2012-12-11: 40 mg via INTRAVENOUS
  Filled 2012-12-10 (×2): qty 40

## 2012-12-10 MED ORDER — DEXTROSE 50 % IV SOLN
INTRAVENOUS | Status: AC
  Filled 2012-12-10: qty 50

## 2012-12-10 MED ORDER — SODIUM CHLORIDE 0.9 % IV SOLN
25.0000 ug/h | INTRAVENOUS | Status: DC
  Administered 2012-12-10: 150 ug/h via INTRAVENOUS
  Administered 2012-12-11: 50 ug/h via INTRAVENOUS
  Filled 2012-12-10 (×2): qty 50

## 2012-12-10 MED ORDER — THIAMINE HCL 100 MG/ML IJ SOLN
Freq: Once | INTRAVENOUS | Status: AC
  Administered 2012-12-10: 22:00:00 via INTRAVENOUS
  Filled 2012-12-10: qty 1000

## 2012-12-10 MED ORDER — PROPOFOL 10 MG/ML IV EMUL
5.0000 ug/kg/min | INTRAVENOUS | Status: DC
  Administered 2012-12-10: 42.143 ug/kg/min via INTRAVENOUS
  Administered 2012-12-10: 60 ug/kg/min via INTRAVENOUS
  Administered 2012-12-11: 20 ug/kg/min via INTRAVENOUS
  Filled 2012-12-10 (×2): qty 100

## 2012-12-10 MED ORDER — LIDOCAINE HCL (CARDIAC) 20 MG/ML IV SOLN
INTRAVENOUS | Status: AC
  Filled 2012-12-10: qty 5

## 2012-12-10 MED ORDER — PROPOFOL 10 MG/ML IV EMUL: 5.0000 ug/kg/min | INTRAVENOUS | Status: DC

## 2012-12-10 MED ORDER — FENTANYL BOLUS VIA INFUSION
25.0000 ug | Freq: Four times a day (QID) | INTRAVENOUS | Status: DC | PRN
  Filled 2012-12-10: qty 100

## 2012-12-10 MED ORDER — SUCCINYLCHOLINE CHLORIDE 20 MG/ML IJ SOLN
INTRAMUSCULAR | Status: AC
  Filled 2012-12-10: qty 1

## 2012-12-10 MED ORDER — SODIUM CHLORIDE 0.9 % IV SOLN
INTRAVENOUS | Status: DC
  Administered 2012-12-10: 20:00:00 via INTRAVENOUS

## 2012-12-10 MED ORDER — CHLORHEXIDINE GLUCONATE 0.12 % MT SOLN
15.0000 mL | Freq: Two times a day (BID) | OROMUCOSAL | Status: DC
  Administered 2012-12-11 – 2012-12-12 (×3): 15 mL via OROMUCOSAL
  Filled 2012-12-10 (×3): qty 15

## 2012-12-10 MED ORDER — ROCURONIUM BROMIDE 50 MG/5ML IV SOLN
INTRAVENOUS | Status: AC
  Filled 2012-12-10: qty 2

## 2012-12-10 NOTE — H&P (Signed)
PULMONARY  / CRITICAL CARE MEDICINE  Name: Xavier [name] MRN: [id number] DOB: 1961/04/02    ADMISSION DATE:  12/10/2012 CONSULTATION DATE:  12/10/2012  REFERRING MD :  EDP PRIMARY SERVICE: PCCM  CHIEF COMPLAINT:  Unresponsive  BRIEF PATIENT DESCRIPTION: 52 year old male alcoholic with chronic pain and narcotic abuse who was found by his daughter unresponsive.  Narcan was used and patient became responsive but EDP decided to intubate patient since patient continued to loose consciousness and was not protecting his airway.  No family bedside, no further history available.  SIGNIFICANT EVENTS / STUDIES:  8/12 unresponsive, OD presumed, intubated.  LINES / TUBES: ET tube 8/12>>> PIV  CULTURES: None  ANTIBIOTICS: None  PAST MEDICAL HISTORY :  Past Medical History  Diagnosis Date  . Hypertension   . Depression   . Back pain   . Asthma    Past Surgical History  Procedure Laterality Date  . Hand tendon surgery     Prior to Admission medications   Medication Sig Start Date End Date Taking? Authorizing Provider  albuterol (PROVENTIL HFA;VENTOLIN HFA) 108 (90 BASE) MCG/ACT inhaler Inhale 2 puffs into the lungs every 6 (six) hours as needed for wheezing.    Historical Provider, MD  QUEtiapine (SEROQUEL) 300 MG tablet Take 300 mg by mouth 2 (two) times daily.     Historical Provider, MD   Allergies  Allergen Reactions  . Nsaids Rash    FAMILY HISTORY:  History reviewed. No pertinent family history. SOCIAL HISTORY:  reports that he has been smoking.  He does not have any smokeless tobacco history on file. He reports that  drinks alcohol. He reports that he uses illicit drugs.  REVIEW OF SYSTEMS:  Unattainable, patient is unresponsive and intubated.  SUBJECTIVE:   VITAL SIGNS: Temp:  [97 F (36.1 C)] 97 F (36.1 C) (08/12 1507) Pulse Rate:  [76-105] 76 (08/12 1645) Resp:  [16-21] 20 (08/12 1645) BP: (104-144)/(73-96) 126/86 mmHg (08/12 1645) SpO2:  [98 %-100 %] 100 %  (08/12 1645) FiO2 (%):  [60 %] 60 % (08/12 1515) HEMODYNAMICS:   VENTILATOR SETTINGS: Vent Mode:  [-] PRVC FiO2 (%):  [60 %] 60 % Set Rate:  [18 bmp] 18 bmp Vt Set:  [500 mL] 500 mL PEEP:  [5 cmH20] 5 cmH20 Plateau Pressure:  [14 cmH20] 14 cmH20 INTAKE / OUTPUT: Intake/Output   None     PHYSICAL EXAMINATION: General:  Chronically ill appearing male, sedated and intubated. Neuro:  Sedated and intubated, moves all ext to pain. HEENT:  [place]/AT, pupils are pin point, EOM spontaneous and MMM. Cardiovascular:  RRR, Nl S1/S2, -M/R/G. Lungs:  CTA bilaterally. Abdomen:  Soft, NT, ND and +BS. Musculoskeletal:  -edema and -tenderness. Skin:  Intact.  LABS:  CBC Recent Labs     12/10/12  1554  WBC  5.4  HGB  14.6  HCT  42.5  PLT  166   Coag's No results found for this basename: APTT, INR,  in the last 72 hours BMET Recent Labs     12/10/12  1554  NA  143  K  4.0  CL  109  CO2  23  BUN  9  CREATININE  0.90  GLUCOSE  117*   Electrolytes Recent Labs     12/10/12  1554  CALCIUM  8.0*   Sepsis Markers No results found for this basename: LACTICACIDVEN, PROCALCITON, O2SATVEN,  in the last 72 hours ABG Recent Labs     12/10/12  1611  PHART  7.393  PCO2ART  39.1  PO2ART  322.0*   Liver Enzymes No results found for this basename: AST, ALT, ALKPHOS, BILITOT, ALBUMIN,  in the last 72 hours Cardiac Enzymes No results found for this basename: TROPONINI, PROBNP,  in the last 72 hours Glucose No results found for this basename: GLUCAP,  in the last 72 hours  Imaging Dg Chest Port 1 View  12/10/2012   *RADIOLOGY REPORT*  Clinical Data: Status post intubation.  PORTABLE CHEST - 1 VIEW  Comparison: None.  Findings: Endotracheal tube is seen with tip approximately 2 cm above the carina.  A nasogastric tube is seen entering the stomach. Both lungs are clear.  Heart size is normal.  No pneumothorax identified.  IMPRESSION: Endotracheal tube and orogastric tube in appropriate  position.  No active lung disease.   Original Report Authenticated By: Myles Rosenthal, M.D.     CXR: ET tube ok.  ASSESSMENT / PLAN:  PULMONARY A: VDRF due to overdose. P:   - Full vent support. - ABG and CXR in AM. - F/U ABG.  CARDIOVASCULAR A: No active issues, HTN when agitated. P:  - Hydrate. - Monitor.  RENAL A:  No active issues. P:   - Hydrate. - Monitor BMET.  GASTROINTESTINAL A:  No active issues. P:   - OGT in place. - NPO, if unable to extubate then TF in AM.  HEMATOLOGIC A:  No active issues. P:  - Monitor daily CBC.  INFECTIOUS A:  No active issues. P:   - Monitor WBC and fever curves.  ENDOCRINE A:  ?DM.   P:   - CBGs and ISS.  NEUROLOGIC A:  Unresponsive due to OD.  Head CT negative. P:   - Continuous sedation. - Thiamine/folate/MVI.  TODAY'S SUMMARY: Narc OD, intubated, SBT in AM.  I have personally obtained a history, examined the patient, evaluated laboratory and imaging results, formulated the assessment and plan and placed orders.  CRITICAL CARE: The patient is critically ill with multiple organ systems failure and requires high complexity decision making for assessment and support, frequent evaluation and titration of therapies, application of advanced monitoring technologies and extensive interpretation of multiple databases. Critical Care Time devoted to patient care services described in this note is 35 minutes.   Alyson Reedy, M.D. Pulmonary and Critical Care Medicine [facility] Pager: [phone]  12/10/2012, 5:19 PM

## 2012-12-10 NOTE — ED Notes (Signed)
Pt. Transported on vent to CT & back then to 2100.

## 2012-12-10 NOTE — ED Provider Notes (Signed)
CSN: [id number]     Arrival date & time 12/10/12  1457 History     First MD Initiated Contact with Patient 12/10/12 1503     Chief Complaint  Patient presents with  . Unresponsive    (Consider location/radiation/quality/duration/timing/severity/associated sxs/prior Treatment) HPI Comments: Nasal airway placed by EMS. Given Narcan with mild response.  Patient is a 52 y.o. male presenting with altered mental status. The history is provided by the patient.  Altered Mental Status Presenting symptoms: unresponsiveness   Severity:  Severe Most recent episode:  Today Episode history:  Multiple Timing:  Constant Progression:  Worsening Chronicity:  New Context: drug use (narcotics history, family reported to EMS some recreational narcotic use also)   Associated symptoms: difficulty breathing   Associated symptoms: no abdominal pain     Past Medical History  Diagnosis Date  . Hypertension   . Depression   . Back pain   . Asthma    Past Surgical History  Procedure Laterality Date  . Hand tendon surgery     History reviewed. No pertinent family history. History  Substance Use Topics  . Smoking status: Current Every Day Smoker  . Smokeless tobacco: Not on file  . Alcohol Use: Yes    Review of Systems  Unable to perform ROS: Patient unresponsive  Gastrointestinal: Negative for abdominal pain.  Psychiatric/Behavioral: Positive for altered mental status.    Allergies  Nsaids  Home Medications   Current Outpatient Rx  Name  Route  Sig  Dispense  Refill  . albuterol (PROVENTIL HFA;VENTOLIN HFA) 108 (90 BASE) MCG/ACT inhaler   Inhalation   Inhale 2 puffs into the lungs every 6 (six) hours as needed for wheezing.         Marland Kitchen QUEtiapine (SEROQUEL) 300 MG tablet   Oral   Take 300 mg by mouth 2 (two) times daily.           BP 126/95  Pulse 88  Temp(Src) 97 F (36.1 C) (Rectal)  Resp 16  SpO2 100% Physical Exam  Nursing note and vitals reviewed. Constitutional:  He appears well-developed and well-nourished. He appears distressed.  HENT:  Head: Normocephalic and atraumatic.  Mouth/Throat: No oropharyngeal exudate.  Eyes: EOM are normal. Pupils are equal, round, and reactive to light.  Neck: Normal range of motion. Neck supple.  Cardiovascular: Normal rate and regular rhythm.  Exam reveals no friction rub.   No murmur heard. Pulmonary/Chest: Breath sounds normal. He is in respiratory distress (Labored, sonorous respirations). He has no wheezes. He has no rales.  Abdominal: He exhibits no distension. There is no tenderness. There is no rebound.  Musculoskeletal: Normal range of motion. He exhibits no edema.  Neurological:  Unresponsive. Moving all extremities after Narcan.  Skin: He is not diaphoretic.    ED Course   INTUBATION Date/Time: 12/10/2012 3:31 PM Performed by: Dagmar Hait Authorized by: Dagmar Hait Consent: The procedure was performed in an emergent situation. Time out: Immediately prior to procedure a "time out" was called to verify the correct patient, procedure, equipment, support staff and site/side marked as required. Indications: airway protection Intubation method: video-assisted Patient status: paralyzed (RSI) Preoxygenation: nonrebreather mask Sedatives: etomidate Paralytic: succinylcholine Tube size: 7.5 mm Tube type: cuffed Number of attempts: 1 Cricoid pressure: no Cords visualized: yes Post-procedure assessment: chest rise and ETCO2 monitor Breath sounds: equal Cuff inflated: yes ETT to lip: 23 cm ETT to teeth: 22 cm Tube secured with: ETT holder Chest x-ray interpreted by me. Chest x-ray findings:  endotracheal tube in appropriate position   (including critical care time)  Labs Reviewed  CBC  URINE RAPID DRUG SCREEN (HOSP PERFORMED)  BASIC METABOLIC PANEL  BLOOD GAS, ARTERIAL  LACTIC ACID, PLASMA  URINALYSIS, ROUTINE W REFLEX MICROSCOPIC  ETHANOL  SALICYLATE LEVEL  ACETAMINOPHEN  LEVEL   No results found. No diagnosis found.   Date: 12/10/2012  Rate: 84  Rhythm: normal sinus rhythm  QRS Axis: normal  Intervals: normal  ST/T Wave abnormalities: normal  Conduction Disutrbances:none  Narrative Interpretation:   Old EKG Reviewed: unchanged  CRITICAL CARE Performed by: Dagmar Hait   Total critical care time: 30 minutes Critical care time was exclusive of separately billable procedures and treating other patients.  Critical care was necessary to treat or prevent imminent or life-threatening deterioration.  Critical care was time spent personally by me on the following activities: development of treatment plan with patient and/or surrogate as well as nursing, discussions with consultants, evaluation of patient's response to treatment, examination of patient, obtaining history from patient or surrogate, ordering and performing treatments and interventions, ordering and review of laboratory studies, ordering and review of radiographic studies, pulse oximetry and re-evaluation of patient's condition.   MDM   -year-old male presents unresponsive. History of drug use. Family at the scene stated some recreational no current use today also. Vitals are stable with EMS. Patient was with sonorous respirations upon arrival. He did receive Narcan however he had minimal response with EMS. Here, he is given 0.4 mg Narcan with some immediate response, however within about a minute patient was down again with sonorous respirations. When he did wake up, he is extremely combative aggressive or things that are not there. He did not ever give [place] any verbal response or follow any commands. He is not protecting his airway after the Narcan administration, so I intubated him. Labs drawn, placed on Propofol drip for sedation. CT Head ordered.  Critical care admitting. I had to titrate his propofol drip multiple times for sedation control.    Dagmar Hait, MD 12/11/12  [phone]

## 2012-12-10 NOTE — Progress Notes (Signed)
eLink Physician-Brief Progress Note Patient Name: Xavier [name] DOB: 1960/08/20 MRN: [id number]  Date of Service  12/10/2012   HPI/Events of Note  Low sugar   eICU Interventions  Start d5 h alf normal saline at 50cc/h   Intervention Category Intermediate Interventions: Other:  Billie Trager 12/10/2012, 11:52 PM

## 2012-12-10 NOTE — ED Notes (Signed)
Pt was found outside unresponsive. Pt had pinpoint pupils, did not respond to painful stimuli. Daughter was on scene stated that pt had a hx of alcoholism and narcotics abuse. Pt had a lot to drink today, but they were unsure of his narcotic use. Pt was given 0.5mg  Narcan, and 4mg  Zofran. Minimum response to narcan, pt kind of aroused, acted as though he was going to vomit and then went back unresponsive. Vitals 113/87, 81 Pulse, CBG 124, 100% NR 15L. Pt was found outside in sweatshirt and not sweating.

## 2012-12-11 ENCOUNTER — Inpatient Hospital Stay (HOSPITAL_COMMUNITY): Payer: Medicaid Other

## 2012-12-11 DIAGNOSIS — F1010 Alcohol abuse, uncomplicated: Secondary | ICD-10-CM

## 2012-12-11 LAB — MAGNESIUM: Magnesium: 1.7 mg/dL (ref 1.5–2.5)

## 2012-12-11 LAB — BASIC METABOLIC PANEL
BUN: 10 mg/dL (ref 6–23)
GFR calc Af Amer: >90 mL/min (ref >90)
GFR calc non Af Amer: 80 mL/min — ABNORMAL LOW (ref >90)
Potassium: 3.7 mEq/L (ref 3.5–5.1)
Sodium: 144 mEq/L (ref 135–145)

## 2012-12-11 LAB — CBC
HCT: 40.9 % (ref 39.0–52.0)
MCHC: 34.5 g/dL (ref 30.0–36.0)
Platelets: 135 10*3/uL — ABNORMAL LOW (ref 150–400)
RDW: 13.4 % (ref 11.5–15.5)
WBC: 13.8 10*3/uL — ABNORMAL HIGH (ref 4.0–10.5)

## 2012-12-11 LAB — GLUCOSE, CAPILLARY
Glucose-Capillary: 62 mg/dL — ABNORMAL LOW (ref 70–99)
Glucose-Capillary: 74 mg/dL (ref 70–99)
Glucose-Capillary: 97 mg/dL (ref 70–99)
Glucose-Capillary: 90 mg/dL (ref 70–99)

## 2012-12-11 MED ORDER — ADULT MULTIVITAMIN LIQUID CH
5.0000 mL | Freq: Every day | ORAL | Status: DC
  Administered 2012-12-12: 5 mL via ORAL
  Filled 2012-12-11 (×2): qty 5

## 2012-12-11 MED ORDER — DEXTROSE 50 % IV SOLN
INTRAVENOUS | Status: AC
  Administered 2012-12-11: 50 mL
  Filled 2012-12-11: qty 50

## 2012-12-11 MED ORDER — QUETIAPINE FUMARATE 50 MG PO TABS
150.0000 mg | ORAL_TABLET | Freq: Two times a day (BID) | ORAL | Status: DC
  Administered 2012-12-11 – 2012-12-12 (×2): 150 mg via ORAL
  Filled 2012-12-11 (×3): qty 1

## 2012-12-11 MED ORDER — SODIUM CHLORIDE 0.9 % IV SOLN
1.0000 mg | Freq: Once | INTRAVENOUS | Status: AC
  Administered 2012-12-11: 1 mg via INTRAVENOUS
  Filled 2012-12-11: qty 0.2

## 2012-12-11 MED ORDER — DEXTROSE 50 % IV SOLN: 1.0000 | Freq: Once | INTRAVENOUS | Status: AC

## 2012-12-11 MED ORDER — HEPARIN SODIUM (PORCINE) 5000 UNIT/ML IJ SOLN
5000.0000 [IU] | Freq: Three times a day (TID) | INTRAMUSCULAR | Status: DC
  Administered 2012-12-11 – 2012-12-12 (×3): 5000 [IU] via SUBCUTANEOUS
  Filled 2012-12-11 (×6): qty 1

## 2012-12-11 MED ORDER — THIAMINE HCL 100 MG/ML IJ SOLN
100.0000 mg | Freq: Every day | INTRAMUSCULAR | Status: DC
  Administered 2012-12-11 – 2012-12-12 (×2): 100 mg via INTRAVENOUS
  Filled 2012-12-11 (×2): qty 1

## 2012-12-11 MED ORDER — SODIUM CHLORIDE 0.9 % IV BOLUS (SEPSIS)
1000.0000 mL | Freq: Once | INTRAVENOUS | Status: AC
  Administered 2012-12-11: 1000 mL via INTRAVENOUS

## 2012-12-11 MED ORDER — MAGNESIUM SULFATE 40 MG/ML IJ SOLN
4.0000 g | Freq: Once | INTRAMUSCULAR | Status: AC
  Administered 2012-12-11: 4 g via INTRAVENOUS
  Filled 2012-12-11: qty 100

## 2012-12-11 MED ORDER — FENTANYL CITRATE 0.05 MG/ML IJ SOLN
INTRAMUSCULAR | Status: AC
  Administered 2012-12-11: 100 ug
  Filled 2012-12-11: qty 2

## 2012-12-11 NOTE — Care Management Note (Signed)
    Page 1 of 1   12/11/2012     2:16:40 PM   CARE MANAGEMENT NOTE 12/11/2012  Patient:  Xavier [name], Xavier [name]   Account Number:  [id number]  Date Initiated:  12/11/2012  Documentation initiated by:  Xavier [name]  Subjective/Objective Assessment:   Found down - Presumed OD - intubated.     Action/Plan:   Anticipated DC Date:  12/16/2012   Anticipated DC Plan:  [facility]      DC Planning Services  CM consult      Choice offered to / List presented to:             Status of service:  In process, will continue to follow Medicare Important Message given?   (If response is "NO", the following Medicare IM given date fields will be blank) Date Medicare IM given:   Date Additional Medicare IM given:    Discharge Disposition:    Per UR Regulation:  Reviewed for med. necessity/level of care/duration of stay  If discussed at Long Length of Stay Meetings, dates discussed:    Comments:  Contact:  Daugter - Xavier [name] [phone]                 Wife Xavier [name] [phone]  12-11-12 2:10pm Xavier [name], [place] [phone] Patient extubated - called in room as patient anxious and wants daughter notified.  STates daughters name is Xavier [name] and is [place] in the [employer].  Will need to contact [employer] - SW consult placed.  Stated aunt Xavier [name] - at [phone] or sister Xavier [name] [phone] may be able to assist in contacting.  SW consulted for assitance in contacting through [employer].   States is separated from wife and spokesperson should be Xavier [name].

## 2012-12-11 NOTE — H&P (Signed)
PULMONARY  / CRITICAL CARE MEDICINE  Name: Xavier [name] MRN: [id number] DOB: 1960-05-11    ADMISSION DATE:  12/10/2012 CONSULTATION DATE:  12/10/2012  REFERRING MD :  EDP PRIMARY SERVICE: PCCM  CHIEF COMPLAINT:  Unresponsive  BRIEF PATIENT DESCRIPTION: 52 year old male alcoholic with chronic pain and narcotic abuse who was found by his daughter unresponsive. Vent  SIGNIFICANT EVENTS / STUDIES:  8/12 unresponsive, OD presumed, intubated. 8/13- resolved ms  LINES / TUBES: ET tube 8/12>>> PIV  CULTURES: None  ANTIBIOTICS: None  SUBJECTIVE: follows commands  VITAL SIGNS: Temp:  [97 F (36.1 C)-99.5 F (37.5 C)] 99.5 F (37.5 C) (08/13 0800) Pulse Rate:  [53-105] 95 (08/13 1000) Resp:  [0-23] 20 (08/13 1000) BP: (70-144)/(40-106) 138/106 mmHg (08/13 1000) SpO2:  [97 %-100 %] 100 % (08/13 1000) FiO2 (%):  [40 %-60 %] 40 % (08/13 1000) Weight:  [59.9 kg (132 lb 0.9 oz)-62.2 kg (137 lb 2 oz)] 62.2 kg (137 lb 2 oz) (08/12 1944) HEMODYNAMICS:   VENTILATOR SETTINGS: Vent Mode:  [-] PSV FiO2 (%):  [40 %-60 %] 40 % Set Rate:  [16 bmp-18 bmp] 16 bmp Vt Set:  [500 mL] 500 mL PEEP:  [5 cmH20] 5 cmH20 Pressure Support:  [5 cmH20] 5 cmH20 Plateau Pressure:  [14 cmH20-15 cmH20] 15 cmH20 INTAKE / OUTPUT: Intake/Output     08/12 0701 - 08/13 0700 08/13 0701 - 08/14 0700   I.V. (mL/kg) 1942.5 (31.2) 326.9 (5.3)   Other 60    IV Piggyback 2000    Total Intake(mL/kg) 4002.5 (64.3) 326.9 (5.3)   Urine (mL/kg/hr) 330 350 (1.3)   Emesis/NG output 5    Total Output 335 350   Net +3667.5 -23.1          PHYSICAL EXAMINATION: General: awake alert Neuro:  rass 1 HEENT:  jvd increased Cardiovascular:  RRR, Nl S1/S2, -M/R/G. Lungs:  CTA bilaterally. Abdomen:  Soft, NT, ND and +BS. Musculoskeletal:  -edema and -tenderness. Skin:  Intact.  LABS:  CBC Recent Labs     12/10/12  1554  12/10/12  1816  12/11/12  0415  WBC  5.4  5.8  13.8*  HGB  14.6  15.1  14.1  HCT  42.5   42.3  40.9  PLT  166  171  135*   Coag's Recent Labs     12/10/12  1816  INR  1.12   BMET Recent Labs     12/10/12  1554  12/10/12  1816  12/11/12  0415  NA  143  143  144  K  4.0  4.2  3.7  CL  109  108  113*  CO2  23  22  24   BUN  9  10  10   CREATININE  0.90  0.92  1.06  GLUCOSE  117*  100*  130*   Electrolytes Recent Labs     12/10/12  1554  12/10/12  1816  12/11/12  0415  CALCIUM  8.0*  8.3*  7.4*  MG   --   2.0  1.7  PHOS   --   2.3  2.7   Sepsis Markers No results found for this basename: LACTICACIDVEN, PROCALCITON, O2SATVEN,  in the last 72 hours ABG Recent Labs     12/10/12  1611  PHART  7.393  PCO2ART  39.1  PO2ART  322.0*   Liver Enzymes Recent Labs     12/10/12  1816  AST  50*  ALT  63*  ALKPHOS  45  BILITOT  0.5  ALBUMIN  3.4*   Cardiac Enzymes No results found for this basename: TROPONINI, PROBNP,  in the last 72 hours Glucose Recent Labs     12/10/12  1927  12/10/12  2332  12/11/12  0346  12/11/12  0537  12/11/12  0813  GLUCAP  88  69*  62*  72  74    Imaging Ct Head Wo Contrast  12/10/2012   *RADIOLOGY REPORT*  Clinical Data: Unresponsive, possible overdose  CT HEAD WITHOUT CONTRAST  Technique:  Contiguous axial images were obtained from the base of the skull through the vertex without contrast.  Comparison: None.  Findings: No acute intracranial hemorrhage.  No focal mass lesion. No CT evidence of acute infarction.   No midline shift or mass effect.  No hydrocephalus.  Basilar cisterns are patent.  There is opacification ethmoid air cells and mucosal thickening. Frontal sinuses are clear.  IMPRESSION:  1.  No acute intracranial findings. 2.  Ethmoid air cell opacification and mucosal thickening.   Original Report Authenticated By: Genevive Bi, M.D.   Dg Chest Port 1 View  12/11/2012   *RADIOLOGY REPORT*  Clinical Data: Evaluate endotracheal tube placement.  PORTABLE CHEST - 1 VIEW  Comparison: Chest x-ray 12/10/2012.   Findings: An endotracheal tube is in place with tip 3.1 cm above the carina. A nasogastric tube is seen extending into the stomach, however, the tip of the nasogastric tube extends below the lower margin of the image.  Lung volumes are normal.  No consolidative airspace disease.  No pleural effusions.  No evidence of pneumothorax.  Pulmonary vasculature and the cardiomediastinal silhouette are within normal limits.  IMPRESSION: 1.  Support apparatus, as above. 2.  No radiographic evidence of acute cardiopulmonary disease.   Original Report Authenticated By: Trudie Reed, M.D.   Dg Chest Port 1 View  12/10/2012   *RADIOLOGY REPORT*  Clinical Data: Status post intubation.  PORTABLE CHEST - 1 VIEW  Comparison: None.  Findings: Endotracheal tube is seen with tip approximately 2 cm above the carina.  A nasogastric tube is seen entering the stomach. Both lungs are clear.  Heart size is normal.  No pneumothorax identified.  IMPRESSION: Endotracheal tube and orogastric tube in appropriate position.  No active lung disease.   Original Report Authenticated By: Myles Rosenthal, M.D.     CXR: ET wnl, no infiltartes  ASSESSMENT / PLAN:  PULMONARY A: VDRF due to overdose. P:   - wean cpap5 ps 5, assess rsbi -pcxr no infiltrates Will need is  CARDIOVASCULAR A: No active issues, HTN when agitated. P:  - tele as at risk tachy / etoh wd -trop neg -lower volume  RENAL A:  Hypomagnesemia, Hyperchloremia P:   - Hydrate. - Monitor BMET in am  -mag supp change fluids top 1/2 NS  GASTROINTESTINAL A:  No active issues. P:   - OGT in place. - advance if extubated at 4 hrs ppi  HEMATOLOGIC A:  DV tprevention P:  - Monitor daily CBC Add sub q hep.  INFECTIOUS A:  No active issues. P:   - Monitor WBC and fever curves. -pcxr no infiltrates  ENDOCRINE A:  ?DM.   P:   - CBGs and ISS.  NEUROLOGIC A:  Unresponsive due to OD ETOH likely - resolved Head CT negative. P:   - hydration -  Thiamine/folate/MVI addition -Start home ser, lower dose -narcan had a response, eval for WD, may need to add maintenance   TODAY'S  SUMMARY: wean to extubate  I have personally obtained a history, examined the patient, evaluated laboratory and imaging results, formulated the assessment and plan and placed orders.  CRITICAL CARE: The patient is critically ill with multiple organ systems failure and requires high complexity decision making for assessment and support, frequent evaluation and titration of therapies, application of advanced monitoring technologies and extensive interpretation of multiple databases. Critical Care Time devoted to patient care services described in this note is 30 minutes.   Mcarthur Rossetti. Tyson Alias, MD, FACP Pgr: [phone] [facility]  12/11/2012, 11:19 AM

## 2012-12-11 NOTE — Progress Notes (Signed)
CBG 69. Pt NPO. Dr Dalbert Mayotte notified Order received for 1 amp for D50W (50ml) and to change IVF to D51/2NS at 33ml/hr

## 2012-12-11 NOTE — Progress Notes (Signed)
Chaplain Note:  Chaplain visited with pt who was resting in bed.  Pt's sitter was at bedside.  Pt expressed his need for a Bible and his desire to contact his daughter who now serves in the [place].  Chaplain provided the pt with a copy of the Bible and requested assistance from the pt's case manager in contacting the pt's daughter.  Case management will work with CSW to make that contact.  Pt. Expressed appreciation for chaplain support.  Chaplain will follow up as needed.  12/11/12 1400  Clinical Encounter Type  Visited With Patient  Visit Type Spiritual support  Referral From Nurse  Spiritual Encounters  Spiritual Needs [facility] text;Emotional;Other (Comment) (Pt needed to contact daughter who now serves in the [employer])  Stress Factors  Patient Stress Factors Health changes;Lack of caregivers;Loss of control;Major life changes  Family Stress Factors Not reviewed (No family present)  Verdie Shire, Chaplain [phone]

## 2012-12-11 NOTE — Progress Notes (Signed)
eLink Physician-Brief Progress Note Patient Name: Xavier [name] DOB: 12/08/1960 MRN: [id number]  Date of Service  12/11/2012   HPI/Events of Note   [still h yypoglcycemic 65  eICU Interventions  Increase d5 half normal saline from 50c to 100cc/h   Intervention Category Minor Interventions: Other:  Xavier [name] 12/11/2012, 3:56 AM

## 2012-12-11 NOTE — Procedures (Signed)
Extubation Procedure Note  Patient Details:   Name: Xavier [name] DOB: 05-19-1960 MRN: [id number]   Airway Documentation:   Patient weaned from ventilatory support on PS 5/5. Tol well. Patient was extubated and now wearing nasal cannula with oxygen running at 4lpm.  Evaluation  O2 sats: stable throughout Complications: No apparent complications Patient did tolerate procedure well. Bilateral Breath Sounds: Clear;Diminished Suctioning: Airway Yes  Clearance Coots 12/11/2012, 11:24 AM

## 2012-12-11 NOTE — Progress Notes (Signed)
Dr Dalbert Mayotte called for decreased Urine output of 25ml over the last 5 hours. Orders received for 2 L NS bolus

## 2012-12-11 NOTE — Progress Notes (Signed)
Dr Dalbert Mayotte called for CBG of 62. Orders received

## 2012-12-11 NOTE — Consult Note (Signed)
Reason for Consult: Alcohol dependence and intoxication Referring Physician: Dr. Jacquiline [name] is an 52 y.o. male.  HPI: Patient is seen and chart reviewed. Patient stated that [facility] by the [facility]. Reportedly patient was placed in [place] for having a verbal altercation with his friend for several days. Patient reported he has been drinking vodka and fell down, unconscious arm the side of the Street. Patient denied alcohol dependence and substance abuse. Patient reportedly reached [facility]/self-help for finding day history. Patient has no previous history of acute psychiatric hospitalization or substance abuse treatment in the electronic chart. Patient denied previous hospitalizations. Patient blood alcohol level is 252 on his index finger was negative for drug of abuse. Patient reported he has no medication to take for the last 2 weeks. Patient stated he has been diagnosed with bipolar disorder and has been taking Seroquel 300 mg daily from the [facility] over several years.  MSE: Mental Status Examination: Patient is calm, quite, cooperate and appeared reading Bible in his bed. He has been disheveled but the long and clean beard. He has fairly groomed, and maintaining good eye contact. Patient has good mood and his affect was constricted. He has normal rate, rhythm, and volume of speech. His thought process is linear and goal directed. Patient has denied suicidal, homicidal ideations, intentions or plans. Patient has no evidence of auditory or visual hallucinations, delusions, and paranoia. Patient has fair insight judgment and impulse control.  Past Medical History  Diagnosis Date  . Hypertension   . Depression   . Back pain   . Asthma     Past Surgical History  Procedure Laterality Date  . Hand tendon surgery      History reviewed. No pertinent family history.  Social History:  reports that he has been  smoking.  He does not have any smokeless tobacco history on file. He reports that  drinks alcohol. He reports that he uses illicit drugs.  Allergies:  Allergies  Allergen Reactions  . Nsaids Rash    Medications: I have reviewed the patient's current medications.  Results for orders placed during the hospital encounter of 12/10/12 (from the past 48 hour(s))  CBC     Status: None   Collection Time    12/10/12  3:54 PM      Result Value Range   WBC 5.4  4.0 - 10.5 K/uL   RBC 4.83  4.22 - 5.81 MIL/uL   Hemoglobin 14.6  13.0 - 17.0 g/dL   HCT 11.9  14.7 - 82.9 %   MCV 88.0  78.0 - 100.0 fL   MCH 30.2  26.0 - 34.0 pg   MCHC 34.4  30.0 - 36.0 g/dL   RDW 56.2  13.0 - 86.5 %   Platelets 166  150 - 400 K/uL  BASIC METABOLIC PANEL     Status: Abnormal   Collection Time    12/10/12  3:54 PM      Result Value Range   Sodium 143  135 - 145 mEq/L   Potassium 4.0  3.5 - 5.1 mEq/L   Chloride 109  96 - 112 mEq/L   CO2 23  19 - 32 mEq/L   Glucose, Bld 117 (*) 70 - 99 mg/dL   BUN 9  6 - 23 mg/dL   Creatinine, Ser 7.84  0.50 - 1.35 mg/dL   Calcium 8.0 (*) 8.4 - 10.5 mg/dL   GFR calc non  Af Amer >90  >90 mL/min   GFR calc Af Amer >90  >90 mL/min   Comment:            The eGFR has been calculated     using the CKD EPI equation.     This calculation has not been     validated in all clinical     situations.     eGFR's persistently     <90 mL/min signify     possible Chronic Kidney Disease.  LACTIC ACID, PLASMA     Status: None   Collection Time    12/10/12  3:54 PM      Result Value Range   Lactic Acid, Venous 1.9  0.5 - 2.2 mmol/L  ETHANOL     Status: Abnormal   Collection Time    12/10/12  3:54 PM      Result Value Range   Alcohol, Ethyl (B) 272 (*) 0 - 11 mg/dL   Comment:            LOWEST DETECTABLE LIMIT FOR     SERUM ALCOHOL IS 11 mg/dL     FOR MEDICAL PURPOSES ONLY  SALICYLATE LEVEL     Status: Abnormal   Collection Time    12/10/12  3:54 PM      Result Value Range    Salicylate Lvl <2.0 (*) 2.8 - 20.0 mg/dL  ACETAMINOPHEN LEVEL     Status: None   Collection Time    12/10/12  3:54 PM      Result Value Range   Acetaminophen (Tylenol), Serum <15.0  10 - 30 ug/mL   Comment:            THERAPEUTIC CONCENTRATIONS VARY     SIGNIFICANTLY. A RANGE OF 10-30     ug/mL MAY BE AN EFFECTIVE     CONCENTRATION FOR MANY PATIENTS.     HOWEVER, SOME ARE BEST TREATED     AT CONCENTRATIONS OUTSIDE THIS     RANGE.     ACETAMINOPHEN CONCENTRATIONS     >150 ug/mL AT 4 HOURS AFTER     INGESTION AND >50 ug/mL AT 12     HOURS AFTER INGESTION ARE     OFTEN ASSOCIATED WITH TOXIC     REACTIONS.  POCT I-STAT 3, BLOOD GAS (G3+)     Status: Abnormal   Collection Time    12/10/12  4:11 PM      Result Value Range   pH, Arterial 7.393  7.350 - 7.450   pCO2 arterial 39.1  35.0 - 45.0 mmHg   pO2, Arterial 322.0 (*) 80.0 - 100.0 mmHg   Bicarbonate 24.1 (*) 20.0 - 24.0 mEq/L   TCO2 25  0 - 100 mmol/L   O2 Saturation 100.0     Acid-base deficit 1.0  0.0 - 2.0 mmol/L   Patient temperature 97.0 F     Collection site RADIAL, ALLEN'S TEST ACCEPTABLE     Drawn by RT     Sample type ARTERIAL    URINE RAPID DRUG SCREEN (HOSP PERFORMED)     Status: None   Collection Time    12/10/12  5:35 PM      Result Value Range   Opiates NONE DETECTED  NONE DETECTED   Cocaine NONE DETECTED  NONE DETECTED   Benzodiazepines NONE DETECTED  NONE DETECTED   Amphetamines NONE DETECTED  NONE DETECTED   Tetrahydrocannabinol NONE DETECTED  NONE DETECTED   Barbiturates NONE DETECTED  NONE DETECTED  Comment:            DRUG SCREEN FOR MEDICAL PURPOSES     ONLY.  IF CONFIRMATION IS NEEDED     FOR ANY PURPOSE, NOTIFY LAB     WITHIN 5 DAYS.                LOWEST DETECTABLE LIMITS     FOR URINE DRUG SCREEN     Drug Class       Cutoff (ng/mL)     Amphetamine      1000     Barbiturate      200     Benzodiazepine   200     Tricyclics       300     Opiates          300     Cocaine          300      THC              50  URINALYSIS, ROUTINE W REFLEX MICROSCOPIC     Status: None   Collection Time    12/10/12  5:35 PM      Result Value Range   Color, Urine YELLOW  YELLOW   APPearance CLEAR  CLEAR   Specific Gravity, Urine 1.012  1.005 - 1.030   pH 6.0  5.0 - 8.0   Glucose, UA NEGATIVE  NEGATIVE mg/dL   Hgb urine dipstick NEGATIVE  NEGATIVE   Bilirubin Urine NEGATIVE  NEGATIVE   Ketones, ur NEGATIVE  NEGATIVE mg/dL   Protein, ur NEGATIVE  NEGATIVE mg/dL   Urobilinogen, UA 0.2  0.0 - 1.0 mg/dL   Nitrite NEGATIVE  NEGATIVE   Leukocytes, UA NEGATIVE  NEGATIVE   Comment: MICROSCOPIC NOT DONE ON URINES WITH NEGATIVE PROTEIN, BLOOD, LEUKOCYTES, NITRITE, OR GLUCOSE <1000 mg/dL.  CBC WITH DIFFERENTIAL     Status: Abnormal   Collection Time    12/10/12  6:16 PM      Result Value Range   WBC 5.8  4.0 - 10.5 K/uL   RBC 4.83  4.22 - 5.81 MIL/uL   Hemoglobin 15.1  13.0 - 17.0 g/dL   HCT 16.1  09.6 - 04.5 %   MCV 87.6  78.0 - 100.0 fL   MCH 31.3  26.0 - 34.0 pg   MCHC 35.7  30.0 - 36.0 g/dL   RDW 40.9  81.1 - 91.4 %   Platelets 171  150 - 400 K/uL   Neutrophils Relative % 26 (*) 43 - 77 %   Neutro Abs 1.5 (*) 1.7 - 7.7 K/uL   Lymphocytes Relative 62 (*) 12 - 46 %   Lymphs Abs 3.6  0.7 - 4.0 K/uL   Monocytes Relative 7  3 - 12 %   Monocytes Absolute 0.4  0.1 - 1.0 K/uL   Eosinophils Relative 4  0 - 5 %   Eosinophils Absolute 0.2  0.0 - 0.7 K/uL   Basophils Relative 1  0 - 1 %   Basophils Absolute 0.1  0.0 - 0.1 K/uL  COMPREHENSIVE METABOLIC PANEL     Status: Abnormal   Collection Time    12/10/12  6:16 PM      Result Value Range   Sodium 143  135 - 145 mEq/L   Potassium 4.2  3.5 - 5.1 mEq/L   Chloride 108  96 - 112 mEq/L   CO2 22  19 - 32 mEq/L   Glucose, Bld 100 (*) 70 -  99 mg/dL   BUN 10  6 - 23 mg/dL   Creatinine, Ser 4.13  0.50 - 1.35 mg/dL   Calcium 8.3 (*) 8.4 - 10.5 mg/dL   Total Protein 6.1  6.0 - 8.3 g/dL   Albumin 3.4 (*) 3.5 - 5.2 g/dL   AST 50 (*) 0 - 37 U/L    ALT 63 (*) 0 - 53 U/L   Alkaline Phosphatase 45  39 - 117 U/L   Total Bilirubin 0.5  0.3 - 1.2 mg/dL   GFR calc non Af Amer >90  >90 mL/min   GFR calc Af Amer >90  >90 mL/min   Comment:            The eGFR has been calculated     using the CKD EPI equation.     This calculation has not been     validated in all clinical     situations.     eGFR's persistently     <90 mL/min signify     possible Chronic Kidney Disease.  PROTIME-INR     Status: None   Collection Time    12/10/12  6:16 PM      Result Value Range   Prothrombin Time 14.2  11.6 - 15.2 seconds   INR 1.12  0.00 - 1.49  MAGNESIUM     Status: None   Collection Time    12/10/12  6:16 PM      Result Value Range   Magnesium 2.0  1.5 - 2.5 mg/dL  PHOSPHORUS     Status: None   Collection Time    12/10/12  6:16 PM      Result Value Range   Phosphorus 2.3  2.3 - 4.6 mg/dL  GLUCOSE, CAPILLARY     Status: None   Collection Time    12/10/12  7:27 PM      Result Value Range   Glucose-Capillary 88  70 - 99 mg/dL  MRSA PCR SCREENING     Status: None   Collection Time    12/10/12  7:40 PM      Result Value Range   MRSA by PCR NEGATIVE  NEGATIVE   Comment:            The GeneXpert MRSA Assay (FDA     approved for NASAL specimens     only), is one component of a     comprehensive MRSA colonization     surveillance program. It is not     intended to diagnose MRSA     infection nor to guide or     monitor treatment for     MRSA infections.  GLUCOSE, CAPILLARY     Status: Abnormal   Collection Time    12/10/12 11:32 PM      Result Value Range   Glucose-Capillary 69 (*) 70 - 99 mg/dL   Comment 1 Notify RN    GLUCOSE, CAPILLARY     Status: Abnormal   Collection Time    12/11/12  3:46 AM      Result Value Range   Glucose-Capillary 62 (*) 70 - 99 mg/dL   Comment 1 Notify RN    CBC     Status: Abnormal   Collection Time    12/11/12  4:15 AM      Result Value Range   WBC 13.8 (*) 4.0 - 10.5 K/uL   RBC 4.60  4.22 -  5.81 MIL/uL   Hemoglobin 14.1  13.0 - 17.0 g/dL  HCT 40.9  39.0 - 52.0 %   MCV 88.9  78.0 - 100.0 fL   MCH 30.7  26.0 - 34.0 pg   MCHC 34.5  30.0 - 36.0 g/dL   RDW 40.9  81.1 - 91.4 %   Platelets 135 (*) 150 - 400 K/uL  BASIC METABOLIC PANEL     Status: Abnormal   Collection Time    12/11/12  4:15 AM      Result Value Range   Sodium 144  135 - 145 mEq/L   Potassium 3.7  3.5 - 5.1 mEq/L   Chloride 113 (*) 96 - 112 mEq/L   CO2 24  19 - 32 mEq/L   Glucose, Bld 130 (*) 70 - 99 mg/dL   BUN 10  6 - 23 mg/dL   Creatinine, Ser 7.82  0.50 - 1.35 mg/dL   Calcium 7.4 (*) 8.4 - 10.5 mg/dL   GFR calc non Af Amer 80 (*) >90 mL/min   GFR calc Af Amer >90  >90 mL/min   Comment:            The eGFR has been calculated     using the CKD EPI equation.     This calculation has not been     validated in all clinical     situations.     eGFR's persistently     <90 mL/min signify     possible Chronic Kidney Disease.  MAGNESIUM     Status: None   Collection Time    12/11/12  4:15 AM      Result Value Range   Magnesium 1.7  1.5 - 2.5 mg/dL  PHOSPHORUS     Status: None   Collection Time    12/11/12  4:15 AM      Result Value Range   Phosphorus 2.7  2.3 - 4.6 mg/dL  GLUCOSE, CAPILLARY     Status: None   Collection Time    12/11/12  5:37 AM      Result Value Range   Glucose-Capillary 72  70 - 99 mg/dL  GLUCOSE, CAPILLARY     Status: None   Collection Time    12/11/12  8:13 AM      Result Value Range   Glucose-Capillary 74  70 - 99 mg/dL  GLUCOSE, CAPILLARY     Status: None   Collection Time    12/11/12 12:02 PM      Result Value Range   Glucose-Capillary 97  70 - 99 mg/dL  GLUCOSE, CAPILLARY     Status: None   Collection Time    12/11/12  3:58 PM      Result Value Range   Glucose-Capillary 98  70 - 99 mg/dL    Ct Head Wo Contrast  12/10/2012   *RADIOLOGY REPORT*  Clinical Data: Unresponsive, possible overdose  CT HEAD WITHOUT CONTRAST  Technique:  Contiguous axial images were  obtained from the base of the skull through the vertex without contrast.  Comparison: None.  Findings: No acute intracranial hemorrhage.  No focal mass lesion. No CT evidence of acute infarction.   No midline shift or mass effect.  No hydrocephalus.  Basilar cisterns are patent.  There is opacification ethmoid air cells and mucosal thickening. Frontal sinuses are clear.  IMPRESSION:  1.  No acute intracranial findings. 2.  Ethmoid air cell opacification and mucosal thickening.   Original Report Authenticated By: Genevive Bi, M.D.   Dg Chest Port 1 View  12/11/2012   *RADIOLOGY REPORT*  Clinical Data: Evaluate endotracheal tube placement.  PORTABLE CHEST - 1 VIEW  Comparison: Chest x-ray 12/10/2012.  Findings: An endotracheal tube is in place with tip 3.1 cm above the carina. A nasogastric tube is seen extending into the stomach, however, the tip of the nasogastric tube extends below the lower margin of the image.  Lung volumes are normal.  No consolidative airspace disease.  No pleural effusions.  No evidence of pneumothorax.  Pulmonary vasculature and the cardiomediastinal silhouette are within normal limits.  IMPRESSION: 1.  Support apparatus, as above. 2.  No radiographic evidence of acute cardiopulmonary disease.   Original Report Authenticated By: Trudie Reed, M.D.   Dg Chest Port 1 View  12/10/2012   *RADIOLOGY REPORT*  Clinical Data: Status post intubation.  PORTABLE CHEST - 1 VIEW  Comparison: None.  Findings: Endotracheal tube is seen with tip approximately 2 cm above the carina.  A nasogastric tube is seen entering the stomach. Both lungs are clear.  Heart size is normal.  No pneumothorax identified.  IMPRESSION: Endotracheal tube and orogastric tube in appropriate position.  No active lung disease.   Original Report Authenticated By: Myles Rosenthal, M.D.    Positive for Alcohol intoxication and history of bipolar disorder Blood pressure 140/88, pulse 70, temperature 99.3 F (37.4 C),  temperature source Oral, resp. rate 13, height 5\' 6"  (1.676 m), weight 62.2 kg (137 lb 2 oz), SpO2 100.00%.   Assessment/Plan: Alcohol intoxication (BAL 272) Bipolar disorder by history  Patient does not meet criteria for acute psychiatric hospitalization as he does not have a safety concerns Referred to the hospital social services regarding placement issues as he become homeless recently Recommended continue Seroquel and referred to the outpatient psychiatrist services off on medically cleared Appreciate psychiatric consultation and will signoff at this time  Xavier [name]., M.D. 12/11/2012, 4:54 PM

## 2012-12-11 NOTE — Progress Notes (Signed)
Utilization review completed.  P.J. Lucian Baswell,RN,BSN Case Manager [phone]  

## 2012-12-12 LAB — BASIC METABOLIC PANEL
BUN: 8 mg/dL (ref 6–23)
Calcium: 8.6 mg/dL (ref 8.4–10.5)
GFR calc Af Amer: >90 mL/min (ref >90)
GFR calc non Af Amer: >90 mL/min (ref >90)
Potassium: 4 mEq/L (ref 3.5–5.1)
Sodium: 138 mEq/L (ref 135–145)

## 2012-12-12 LAB — GLUCOSE, CAPILLARY
Glucose-Capillary: 85 mg/dL (ref 70–99)
Glucose-Capillary: 120 mg/dL — ABNORMAL HIGH (ref 70–99)

## 2012-12-12 MED ORDER — QUETIAPINE FUMARATE 50 MG PO TABS: 150.0000 mg | ORAL_TABLET | Freq: Two times a day (BID) | ORAL | Status: DC

## 2012-12-12 MED ORDER — FOLIC ACID 1 MG PO TABS: 1.0000 mg | ORAL_TABLET | Freq: Every day | ORAL | Status: DC

## 2012-12-12 MED ORDER — ADULT MULTIVITAMIN W/MINERALS CH: 1.0000 | ORAL_TABLET | Freq: Every day | ORAL | Status: DC

## 2012-12-12 MED ORDER — SODIUM CHLORIDE 0.9 % IV SOLN
1.0000 mg | Freq: Every day | INTRAVENOUS | Status: DC
  Administered 2012-12-12: 1 mg via INTRAVENOUS
  Filled 2012-12-12: qty 0.2

## 2012-12-12 MED ORDER — ALBUTEROL SULFATE HFA 108 (90 BASE) MCG/ACT IN AERS: 2.0000 | INHALATION_SPRAY | Freq: Four times a day (QID) | RESPIRATORY_TRACT | Status: DC | PRN

## 2012-12-12 MED ORDER — VITAMIN B-1 100 MG PO TABS: 100.0000 mg | ORAL_TABLET | Freq: Every day | ORAL | Status: DC

## 2012-12-12 NOTE — Clinical Social Work Psych Assess (Signed)
Clinical Social Work Department CLINICAL SOCIAL WORK PSYCHIATRY SERVICE LINE ASSESSMENT 12/12/2012  Patient:  Xavier [name]  Account:  [id number]  Admit Date:  12/10/2012  Clinical Social Worker:  Read Drivers  Date/Time:  12/12/2012 12:21 PM Referred by:  Physician  Date referred:  12/12/2012 Reason for Referral  Behavioral Health Issues   Presenting Symptoms/Problems (In the person's/family's own words):   Psych consult: possible OD and ETOH abuse   Abuse/Neglect/Trauma History (check all that apply)  Denies history   Abuse/Neglect/Trauma Comments:   none reported or noted in chart   Psychiatric History (check all that apply)  Inpatient/hospitilization  Outpatient treatment   Psychiatric medications:  QUEtiapine (SEROQUEL) tablet 150 mg  Dose: 150 mg Freq: 2 times daily Route: PO  Start: 12/11/12 2200   Current Mental Health Hospitalizations/Previous Mental Health History:   Pt reports being admitted to [facility] "years ago" to help with sx of schizophrenia- medication managment   Current provider:   Pt reports being a current pt at a "behavioral health clinic" on [address] as well as a phyisician's office off of [address]   Place and Date:   ongoing   Current Medications:   Scheduled Meds:      . chlorhexidine  15 mL Mouth/Throat BID  . folic acid (FOLVITE) IVPB  1 mg Intravenous Daily  . heparin subcutaneous  5,000 Units Subcutaneous Q8H  . insulin aspart  2-6 Units Subcutaneous Q4H  . multivitamin  5 mL Oral Daily  . QUEtiapine  150 mg Oral BID  . thiamine  100 mg Intravenous Daily        Continuous Infusions:      . fentaNYL infusion INTRAVENOUS Stopped (12/11/12 1100)    . propofol Stopped (12/11/12 0830)          PRN Meds:.fentaNYL       Previous Impatient Admission/Date/Reason:   please see above information re: inpatient psych and outpatient psych admissions/providers   Emotional Health / Current Symptoms    Suicide/Self Harm   None reported   Suicide attempt in the past:   pt denies  none noted in chart   Other harmful behavior:   pt denies  none noted in chart   Psychotic/Dissociative Symptoms  None reported   Other Psychotic/Dissociative Symptoms:   none reported or noted in chart    Attention/Behavioral Symptoms  Within Normal Limits   Other Attention / Behavioral Symptoms:   none reported or noted in chart    Cognitive Impairment  Orientation - Place  Orientation - Self  Orientation - Situation  Orientation - Time  Poor Judgement  Poor/Impaired Decision-Making   Other Cognitive Impairment:   none reported or noted in chart    Mood and Adjustment  Guarded  Flat    Stress, Anxiety, Trauma, Any Recent Loss/Stressor  Current Legal Problems/Pending Court Date   Anxiety (frequency):   pt recently out of jail and on probation   Phobia (specify):   none reported or noted in chart   Compulsive behavior (specify):   none reported or noted in chart   Obsessive behavior (specify):   none reported or noted in chart   Other:   no other stressors noted in chart or reported   Substance Abuse/Use  None   SBIRT completed (please refer for detailed history):  N  Self-reported substance use:   UDS - nothing detected   Urinary Drug Screen Completed:  Y Alcohol level:   BAL 272 upon admission to ED  Environmental/Housing/Living Arrangement  HOMELESS   Who is in the home:   pt homeless and currently living on the street   Emergency contact:  [place], Daughter   Financial  Stable Income  Social Security Disability Income  Medicaid   Patient's Strengths and Goals (patient's own words):   Pt is currently compliant with medical advice and RXs while inpatient.  Pt received resources from Psych CSW and is open to reaching out to [employer] upon d/c.   Clinical Social Worker's Interpretive Summary:   Psych CSW assessed pt at bedside.  Pt presents with flat and  guarded affect.  Pt presents to [facility] ED after being found unresponsive.  Pt denies SI/HI.  Pt denies AVHD.  Pt denies SA, but admits to drinking.  Pt denies having a problem with drinking.  Pt reports that he drinks on occassion and typically does not drink everyday or even every week.  Pt remains very guarded regarding his support systems and only provided limited information to psych CSW. Psych CSW provided numerous resouces including: shelter lists, [facility] information, hot meals information and emergency financial assistance resources.  Pt was appreciative. Psych CSW reviewed pt information with unit CSW and with RN.   Disposition:  Psych Clinical Social Worker signing off  Vickii Penna, [place] [phone]  Clinical Social Work

## 2012-12-12 NOTE — Progress Notes (Signed)
Pt's IV has been removed and pt's family is at bedside. I had wheelchair at bedside and pt was dressed in own clothes when he stated he was missing $60. I was unable to find any note in the chart about pt arriving in ED with any valuables. The only belongings I have seen on 2100 is personal clothing. Security is at bedside to talk with patient about valuables missing.

## 2012-12-12 NOTE — Plan of Care (Signed)
Problem: Problem: Pain Management Progression Goal: Pain Controlled (History of Chronic Pain) Outcome: Completed/Met Date Met:  12/12/12 Pt states he has an appointment with pain clinic tomorrow 8/15 Goal: PAIN DECREASED WITH MOVEMENT OR ACTIVITY Outcome: Completed/Met Date Met:  12/12/12 Pt able to walk in room with 1 person assist

## 2012-12-12 NOTE — Discharge Summary (Signed)
Physician Discharge Summary  Xavier [name] NFA:[id number] DOB: 1960/08/20 DOA: 12/10/2012  PCP: Dorrene German, MD  Admit date: 12/10/2012 Discharge date: 12/12/2012  Time spent: 30 minutes  Recommendations for Outpatient Follow-up:  1. After discharge patient has said he will go to local homeless shelter   2. Psychiatric social worker has provided the patient with numerous resources including shelter lists, [facility] information, hot meals information and emergency financial resources.  Discharge Diagnoses:  Active Problems:   Acute alcohol overdose/intoxication   HTN (hypertension)   Alcoholism   Acute respiratory failure due to excessive alcohol   Homelessness   Discharge Condition: stable  Diet recommendation: Regular  Filed Weights   12/10/12 1850 12/10/12 1944  Weight: 59.9 kg (132 lb 0.9 oz) 62.2 kg (137 lb 2 oz)    History of present illness:  52 year old male patient with chronic pain and suspected chronic narcotic abuse. Apparently also has underlying alcoholism. Was brought to the emergency department on the date of admission because of suspected overdose on unknown substance. Given his history of chronic pain initially it was suspected that he had overdosed on narcotics but he had only brief transient improvement after administration of Narcan IV. He later began to spontaneously awaken somewhat but was very combative but would not verbally respond or follow questions. He was not protecting his airway well so he was intubated in the emergency department and started on a propofol drip. Prior to the admitting physician's arrival the ER physician to titrate the propofol drip upwards for sedation control. He was subsequently admitted to the ICU.  Hospital Course:  Acute metabolic encephalopathy secondary to severe alcohol intoxication/concomitant narcotics usage -Initial ethanol level 272 -Interestingly enough urine drug screen was negative -Was intubated for airway protection  and was quickly extubated with no further respiratory symptomatology during the hospitalization -Aspirin and acetaminophen levels were within normal ranges -No evidence of hypoglycemia at presentation  History of schizophrenia -Has had inconsistent psychiatric followup prior to this admission -Was evaluated by psychiatry services this admission with no indications for inpatient treatment at this time -Was placed on Seroquel at 150 mg by mouth twice daily by ICU team yesterday-a prescription will be given to the patient -Unclear who had been managing the patient's psychiatric medications prior to this admission. Patient himself admitted he had not been taking regularly and unclear if this is related to lack of refills or just noncompliance. States recently incarcerated but then states was not taking his medications while incarcerated. Patient admits to having a primary care physician as well as has an active Medicaid card. -Psychiatric social worker did evaluate the patient is admission and he was given resources so he can proceed with outpatient followup. He has previously been evaluated by behavioral health in [place] and will likely followup with the [facility] upon discharge  Chronic pain syndrome -Patient reports he has an appointment [facility]13[facility]2012 -Because he will be evaluated by a pain clinic within the next 24 hours he will not receive any new or refilled prescriptions for any narcotic   Homelessness -Patient evaluated by social work during this hospitalization and he has been given numerous resources including shelter lists, hot meals information and emergency financial assistance resources. Please refer to the clinical social workers note dated 12/12/2012  Procedures:  Endotracheal tube 8/12-discontinued 8/13  Consultations:  Psychiatry  Pulmonary critical care medicine  Discharge Exam: Filed Vitals:   12/12/12 1212  BP:    Pulse:  Temp: 99.2 F (37.3 C)  Resp:     Discharge Instructions General: No acute respiratory distress Lungs: Clear to auscultation bilaterally without wheezes or crackles, RA Cardiovascular: Regular rate and rhythm without murmur gallop or rub normal S1 and S2, no peripheral edema or JVD Abdomen: Nontender, nondistended, soft, bowel sounds positive, no rebound, no ascites, no appreciable mass Musculoskeletal: No significant cyanosis, clubbing of bilateral lower extremities Neurological: Alert and oriented x 3, moves all extremities x 4 without focal neurological deficits, CN 2-12 intact      Discharge Orders   Future Orders Complete By Expires   Call MD for:  difficulty breathing, headache or visual disturbances  As directed    Call MD for:  extreme fatigue  As directed    Call MD for:  persistant dizziness or light-headedness  As directed    Diet - low sodium heart healthy  As directed    Increase activity slowly  As directed        Medication List         albuterol 108 (90 BASE) MCG/ACT inhaler  Commonly known as:  PROVENTIL HFA;VENTOLIN HFA  Inhale 2 puffs into the lungs every 6 (six) hours as needed for wheezing.     oxyCODONE-acetaminophen 5-325 MG per tablet  Commonly known as:  PERCOCET/ROXICET  Take 1 tablet by mouth every 4 (four) hours as needed for pain.     QUEtiapine 50 MG tablet  Commonly known as:  SEROQUEL  Take 3 tablets (150 mg total) by mouth 2 (two) times daily.       Allergies  Allergen Reactions  . Nsaids Rash   Follow-up Information   Call AVBUERE,EDWIN A, MD. (to arrange appointment in next 1-2 weeks)    Specialty:  Internal Medicine   Contact information:   [address] [place] [address] [phone]       Please follow up. (Please re-establish with behavioral health using information given to you by the hospital social worker)       Please follow up. (Please keep your appointment with [facility] for 12/13/2012)        Please follow up. (Please use the list of resuorces (including the list of local homeless shelters))        The results of significant diagnostics from this hospitalization (including imaging, microbiology, ancillary and laboratory) are listed below for reference.    Significant Diagnostic Studies: Ct Head Wo Contrast  12/10/2012   *RADIOLOGY REPORT*  Clinical Data: Unresponsive, possible overdose  CT HEAD WITHOUT CONTRAST  Technique:  Contiguous axial images were obtained from the base of the skull through the vertex without contrast.  Comparison: None.  Findings: No acute intracranial hemorrhage.  No focal mass lesion. No CT evidence of acute infarction.   No midline shift or mass effect.  No hydrocephalus.  Basilar cisterns are patent.  There is opacification ethmoid air cells and mucosal thickening. Frontal sinuses are clear.  IMPRESSION:  1.  No acute intracranial findings. 2.  Ethmoid air cell opacification and mucosal thickening.   Original Report Authenticated By: Genevive Bi, M.D.   Dg Chest Port 1 View  12/11/2012   *RADIOLOGY REPORT*  Clinical Data: Evaluate endotracheal tube placement.  PORTABLE CHEST - 1 VIEW  Comparison: Chest x-ray 12/10/2012.  Findings: An endotracheal tube is in place with tip 3.1 cm above the carina. A nasogastric tube is seen extending into the stomach, however, the tip of the nasogastric tube extends below the lower margin  of the image.  Lung volumes are normal.  No consolidative airspace disease.  No pleural effusions.  No evidence of pneumothorax.  Pulmonary vasculature and the cardiomediastinal silhouette are within normal limits.  IMPRESSION: 1.  Support apparatus, as above. 2.  No radiographic evidence of acute cardiopulmonary disease.   Original Report Authenticated By: Trudie Reed, M.D.   Dg Chest Port 1 View  12/10/2012   *RADIOLOGY REPORT*  Clinical Data: Status post intubation.  PORTABLE CHEST - 1 VIEW  Comparison: None.  Findings:  Endotracheal tube is seen with tip approximately 2 cm above the carina.  A nasogastric tube is seen entering the stomach. Both lungs are clear.  Heart size is normal.  No pneumothorax identified.  IMPRESSION: Endotracheal tube and orogastric tube in appropriate position.  No active lung disease.   Original Report Authenticated By: Myles Rosenthal, M.D.    Microbiology: Recent Results (from the past 240 hour(s))  MRSA PCR SCREENING     Status: None   Collection Time    12/10/12  7:40 PM      Result Value Range Status   MRSA by PCR NEGATIVE  NEGATIVE Final   Comment:            The GeneXpert MRSA Assay (FDA     approved for NASAL specimens     only), is one component of a     comprehensive MRSA colonization     surveillance program. It is not     intended to diagnose MRSA     infection nor to guide or     monitor treatment for     MRSA infections.     Labs: Basic Metabolic Panel:  Recent Labs Lab 12/10/12 1554 12/10/12 1816 12/11/12 0415 12/12/12 0330  NA 143 143 144 138  K 4.0 4.2 3.7 4.0  CL 109 108 113* 104  CO2 23 22 24 26   GLUCOSE 117* 100* 130* 80  BUN 9 10 10 8   CREATININE 0.90 0.92 1.06 0.96  CALCIUM 8.0* 8.3* 7.4* 8.6  MG  --  2.0 1.7 2.0  PHOS  --  2.3 2.7 3.8   Liver Function Tests:  Recent Labs Lab 12/10/12 1816  AST 50*  ALT 63*  ALKPHOS 45  BILITOT 0.5  PROT 6.1  ALBUMIN 3.4*   No results found for this basename: LIPASE, AMYLASE,  in the last 168 hours No results found for this basename: AMMONIA,  in the last 168 hours CBC:  Recent Labs Lab 12/10/12 1554 12/10/12 1816 12/11/12 0415  WBC 5.4 5.8 13.8*  NEUTROABS  --  1.5*  --   HGB 14.6 15.1 14.1  HCT 42.5 42.3 40.9  MCV 88.0 87.6 88.9  PLT 166 171 135*   Cardiac Enzymes: No results found for this basename: CKTOTAL, CKMB, CKMBINDEX, TROPONINI,  in the last 168 hours BNP: BNP (last 3 results) No results found for this basename: PROBNP,  in the last 8760 hours CBG:  Recent Labs Lab  12/11/12 1952 12/11/12 2352 12/12/12 0413 12/12/12 0753 12/12/12 1159  GLUCAP 90 86 89 85 120*       Signed:  ELLIS,ALLISON L. ANP [facility] 12/12/2012, 1:42 PM   I have examined the patient, reviewed the chart and modified the above note which I agree with.   Berdella Bacot,MD [phone] 12/12/2012, 3:43 PM

## 2012-12-12 NOTE — Progress Notes (Addendum)
Pt is being discharged out of the hospital and leaving with daughter. Pt states he is homeless but Child psychotherapist has provided him with multiple resources, including shelters, hot meals, etc. Pt is in no distress and IV has been removed. Discharge paperwork was discussed and given to patient with no questions.   Vital signs- HR 80, BP 130/77, temp 99.3, RR 11, 02 97 on RA.

## 2012-12-12 NOTE — Progress Notes (Signed)
Assisting unit CSW informed that pt is medically ready for dc today and reports being homeless. CSW visited pt and explored pt living situation. Pt stated he lived at [facility] however it is now condemned. CSW explored whether pt has any income or family support. Pt stated he does receive an income however will not receive it until next month. Pt stated he lives alone and would not be able to stay with anyone. CSW informed pt that CSW could provide resources for Shelters and that pt would need to go to the shelter as soon as being dc'ed in order to get a bed at the shelter.   CSW informed by RN that pt daughter Brett Albino [phone] has been involved in pt care during admission and would like to speak to CSW. CSW received consent from pt to speak to his daughter, CSW has left a message for pt daughter.   As CSW ended visit with pt, psych CSW was arriving on the unit to see pt. Pt has been cleared by psychiatry and pt will receive resources for housing, meals, outpatient counseling.  Theresia Bough, MSW, Theresia Majors [phone]

## 2013-08-12 ENCOUNTER — Emergency Department (HOSPITAL_COMMUNITY)
Admission: EM | Admit: 2013-08-12 | Discharge: 2013-08-12 | Disposition: A | Discharge status: Home / Self Care | Payer: Medicaid Other | Attending: Emergency Medicine | Admitting: Emergency Medicine

## 2013-08-12 ENCOUNTER — Encounter (HOSPITAL_COMMUNITY): Payer: Self-pay | Admitting: Emergency Medicine

## 2013-08-12 ENCOUNTER — Emergency Department (HOSPITAL_COMMUNITY): Payer: Medicaid Other

## 2013-08-12 DIAGNOSIS — Y929 Unspecified place or not applicable: Secondary | ICD-10-CM | POA: Insufficient documentation

## 2013-08-12 DIAGNOSIS — W19XXXA Unspecified fall, initial encounter: Secondary | ICD-10-CM | POA: Insufficient documentation

## 2013-08-12 DIAGNOSIS — Y939 Activity, unspecified: Secondary | ICD-10-CM | POA: Insufficient documentation

## 2013-08-12 DIAGNOSIS — F17200 Nicotine dependence, unspecified, uncomplicated: Secondary | ICD-10-CM | POA: Insufficient documentation

## 2013-08-12 DIAGNOSIS — IMO0002 Reserved for concepts with insufficient information to code with codable children: Secondary | ICD-10-CM | POA: Insufficient documentation

## 2013-08-12 DIAGNOSIS — M25562 Pain in left knee: Secondary | ICD-10-CM

## 2013-08-12 DIAGNOSIS — Z79899 Other long term (current) drug therapy: Secondary | ICD-10-CM | POA: Insufficient documentation

## 2013-08-12 DIAGNOSIS — F3289 Other specified depressive episodes: Secondary | ICD-10-CM | POA: Insufficient documentation

## 2013-08-12 DIAGNOSIS — S058X9A Other injuries of unspecified eye and orbit, initial encounter: Secondary | ICD-10-CM | POA: Insufficient documentation

## 2013-08-12 DIAGNOSIS — F329 Major depressive disorder, single episode, unspecified: Secondary | ICD-10-CM | POA: Insufficient documentation

## 2013-08-12 DIAGNOSIS — M25572 Pain in left ankle and joints of left foot: Secondary | ICD-10-CM

## 2013-08-12 DIAGNOSIS — S99919A Unspecified injury of unspecified ankle, initial encounter: Principal | ICD-10-CM

## 2013-08-12 DIAGNOSIS — J45909 Unspecified asthma, uncomplicated: Secondary | ICD-10-CM | POA: Insufficient documentation

## 2013-08-12 DIAGNOSIS — S8990XA Unspecified injury of unspecified lower leg, initial encounter: Secondary | ICD-10-CM | POA: Insufficient documentation

## 2013-08-12 DIAGNOSIS — I10 Essential (primary) hypertension: Secondary | ICD-10-CM | POA: Insufficient documentation

## 2013-08-12 DIAGNOSIS — S0502XA Injury of conjunctiva and corneal abrasion without foreign body, left eye, initial encounter: Secondary | ICD-10-CM

## 2013-08-12 DIAGNOSIS — S99929A Unspecified injury of unspecified foot, initial encounter: Principal | ICD-10-CM

## 2013-08-12 MED ORDER — ACETAMINOPHEN 500 MG PO TABS: 500.0000 mg | ORAL_TABLET | Freq: Four times a day (QID) | ORAL | Status: DC | PRN

## 2013-08-12 MED ORDER — OXYCODONE-ACETAMINOPHEN 5-325 MG PO TABS
2.0000 | ORAL_TABLET | Freq: Once | ORAL | Status: AC
  Administered 2013-08-12: 2 via ORAL
  Filled 2013-08-12: qty 2

## 2013-08-12 MED ORDER — TETRACAINE HCL 0.5 % OP SOLN
2.0000 [drp] | Freq: Once | OPHTHALMIC | Status: AC
  Administered 2013-08-12: 2 [drp] via OPHTHALMIC
  Filled 2013-08-12: qty 2

## 2013-08-12 MED ORDER — TRAMADOL HCL 50 MG PO TABS: 50.0000 mg | ORAL_TABLET | Freq: Four times a day (QID) | ORAL | Status: DC | PRN

## 2013-08-12 MED ORDER — FLUORESCEIN SODIUM 1 MG OP STRP
1.0000 | ORAL_STRIP | Freq: Once | OPHTHALMIC | Status: AC
  Administered 2013-08-12: 1 via OPHTHALMIC
  Filled 2013-08-12: qty 1

## 2013-08-12 MED ORDER — POLYMYXIN B-TRIMETHOPRIM 10000-0.1 UNIT/ML-% OP SOLN: 1.0000 [drp] | OPHTHALMIC | Status: DC

## 2013-08-12 NOTE — ED Provider Notes (Signed)
CSN: [id number]     Arrival date & time 08/12/13  1201 History  This chart was scribed for non-physician practitioner Emilia BeckKaitlyn Claudio Mondry, PA-C working with Hilario Quarryanielle S Ray, MD by Leone PayorSonum Patel, ED Scribe. This patient was seen in room TR04C/TR04C and the patient's care was started at 12:50 PM.    Chief Complaint  Patient presents with  . Eye Problem  . Ankle Pain  . Knee Pain      The history is provided by the patient. No language interpreter was used.    HPI Comments: Xavier [name] is a 53 y.o. male who presents to the Emergency Department complaining of constant, unchanged left eye pain that began about 1 week ago. Pt states he believes something came into contact with his eye to cause this pain. He has associated photophobia.   He also complains of constant, unchanged left ankle and knee pain that began 1 week ago after a fall. He describes this pain as aching and severe. He has not taken any OTC pain medication for his symptoms.   Past Medical History  Diagnosis Date  . Hypertension   . Depression   . Back pain   . Asthma    Past Surgical History  Procedure Laterality Date  . Hand tendon surgery     No family history on file. History  Substance Use Topics  . Smoking status: Current Every Day Smoker  . Smokeless tobacco: Not on file  . Alcohol Use: Yes    Review of Systems  Eyes: Positive for photophobia and pain.  Musculoskeletal: Positive for arthralgias and myalgias.  All other systems reviewed and are negative.     Allergies  Nsaids  Home Medications   Prior to Admission medications   Medication Sig Start Date End Date Taking? Authorizing Provider  albuterol (PROVENTIL HFA;VENTOLIN HFA) 108 (90 BASE) MCG/ACT inhaler Inhale 2 puffs into the lungs every 6 (six) hours as needed for wheezing. 12/12/12   Russella DarAllison L Ellis, NP  oxyCODONE-acetaminophen (PERCOCET/ROXICET) 5-325 MG per tablet Take 1 tablet by mouth every 4 (four) hours as needed for pain.    Historical  Provider, MD  QUEtiapine (SEROQUEL) 50 MG tablet Take 3 tablets (150 mg total) by mouth 2 (two) times daily. 12/12/12   Russella DarAllison L Ellis, NP   BP 119/81  Pulse 76  Temp(Src) 98.4 F (36.9 C) (Oral)  Resp 18  Wt 150 lb (68.04 kg)  SpO2 98% Physical Exam  Nursing note and vitals reviewed. Constitutional: He is oriented to person, place, and time. He appears well-developed and well-nourished.  HENT:  Head: Normocephalic and atraumatic.  Eyes: EOM are normal. Pupils are equal, round, and reactive to light.  Corneal abrasion noted at the 9 o'clock position around the iris of the left eye.  Cardiovascular: Normal rate.   Pulmonary/Chest: Effort normal.  Abdominal: He exhibits no distension.  Musculoskeletal: Normal range of motion. He exhibits no edema.  Generalized left knee and ankle tenderness to palpation. No obvious deformity. Full ROM.   Neurological: He is alert and oriented to person, place, and time.  Skin: Skin is warm and dry.  Psychiatric: He has a normal mood and affect.    ED Course  Procedures (including critical care time)  DIAGNOSTIC STUDIES: Oxygen Saturation is 98% on RA, normal by my interpretation.    COORDINATION OF CARE: 1:01 PM Will discharge home with tylenol and Polytrim. Discussed treatment plan with pt at bedside and pt agreed to plan.   Labs Review Labs  Reviewed - No data to display  Imaging Review Dg Ankle Complete Left  08/12/2013   CLINICAL DATA:  [name] 1 week ago.  Lateral left ankle pain.  EXAM: LEFT ANKLE COMPLETE - 3+ VIEW  COMPARISON:  None.  FINDINGS: There is no evidence of fracture, dislocation, or joint effusion. There is no evidence of arthropathy or other focal bone abnormality. Soft tissues are unremarkable.  IMPRESSION: Negative.   Electronically Signed   By: Amie Portlandavid  Ormond M.D.   On: 08/12/2013 12:55   Dg Knee Complete 4 Views Left  08/12/2013   CLINICAL DATA:  [name] 1 week ago.  Left knee pain.  EXAM: LEFT KNEE - COMPLETE 4+ VIEW   COMPARISON:  None.  FINDINGS: There is no evidence of fracture, dislocation, or joint effusion. There is no evidence of arthropathy or other focal bone abnormality. Soft tissues are unremarkable.  IMPRESSION: Negative.   Electronically Signed   By: Amie Portlandavid  Ormond M.D.   On: 08/12/2013 12:55     EKG Interpretation None      MDM   Final diagnoses:  Corneal abrasion, left  Left knee pain  Left ankle pain    1:41 PM Patient has a left corneal abrasion. Xrays unremarkable for acute changes. Patient will have percocet for pain here. Patient willl be discharged with polytrim eyedrops and tramadol for pain. Vitals stable and patient afebrile.   I personally performed the services described in this documentation, which was scribed in my presence. The recorded information has been reviewed and is accurate.   [name], PA-C 08/12/13 1342

## 2013-08-12 NOTE — Discharge Instructions (Signed)
Use polytrim drops as directed until symptoms resolve. Take tylenol as needed for pain. Refer to attached documents for more information.

## 2013-08-12 NOTE — ED Notes (Signed)
Pt states one week ago he was working on some stuff and something hit him in his left eye.  Pt reports pain left knee and ankle from fall.

## 2013-08-13 NOTE — ED Provider Notes (Signed)
History/physical exam/procedure(s) were performed by non-physician practitioner and as supervising physician I was immediately available for consultation/collaboration. I have reviewed all notes and am in agreement with care and plan.   Hilario Quarryanielle S Mattilynn Forrer, MD 08/13/13 [phone]

## 2013-08-30 ENCOUNTER — Emergency Department (HOSPITAL_COMMUNITY)
Admission: EM | Admit: 2013-08-30 | Discharge: 2013-08-31 | Disposition: A | Discharge status: Home / Self Care | Payer: Medicaid Other | Attending: Emergency Medicine | Admitting: Emergency Medicine

## 2013-08-30 ENCOUNTER — Encounter (HOSPITAL_COMMUNITY): Payer: Self-pay | Admitting: Emergency Medicine

## 2013-08-30 DIAGNOSIS — I10 Essential (primary) hypertension: Secondary | ICD-10-CM | POA: Insufficient documentation

## 2013-08-30 DIAGNOSIS — Z79899 Other long term (current) drug therapy: Secondary | ICD-10-CM | POA: Insufficient documentation

## 2013-08-30 DIAGNOSIS — T07XXXA Unspecified multiple injuries, initial encounter: Secondary | ICD-10-CM

## 2013-08-30 DIAGNOSIS — S8990XA Unspecified injury of unspecified lower leg, initial encounter: Secondary | ICD-10-CM | POA: Insufficient documentation

## 2013-08-30 DIAGNOSIS — F329 Major depressive disorder, single episode, unspecified: Secondary | ICD-10-CM | POA: Insufficient documentation

## 2013-08-30 DIAGNOSIS — S99929A Unspecified injury of unspecified foot, initial encounter: Secondary | ICD-10-CM

## 2013-08-30 DIAGNOSIS — J45909 Unspecified asthma, uncomplicated: Secondary | ICD-10-CM | POA: Insufficient documentation

## 2013-08-30 DIAGNOSIS — S0993XA Unspecified injury of face, initial encounter: Secondary | ICD-10-CM | POA: Insufficient documentation

## 2013-08-30 DIAGNOSIS — S199XXA Unspecified injury of neck, initial encounter: Secondary | ICD-10-CM

## 2013-08-30 DIAGNOSIS — F3289 Other specified depressive episodes: Secondary | ICD-10-CM | POA: Insufficient documentation

## 2013-08-30 DIAGNOSIS — IMO0002 Reserved for concepts with insufficient information to code with codable children: Secondary | ICD-10-CM | POA: Insufficient documentation

## 2013-08-30 DIAGNOSIS — S99919A Unspecified injury of unspecified ankle, initial encounter: Secondary | ICD-10-CM

## 2013-08-30 DIAGNOSIS — F17200 Nicotine dependence, unspecified, uncomplicated: Secondary | ICD-10-CM | POA: Insufficient documentation

## 2013-08-30 MED ORDER — TETANUS-DIPHTH-ACELL PERTUSSIS 5-2.5-18.5 LF-MCG/0.5 IM SUSP
0.5000 mL | Freq: Once | INTRAMUSCULAR | Status: DC
  Filled 2013-08-30: qty 0.5

## 2013-08-30 NOTE — ED Notes (Signed)
Bed: [id number] Expected date:  Expected time:  Means of arrival:  Comments: EMS/52 yo male/in custody

## 2013-08-30 NOTE — ED Provider Notes (Signed)
CSN: [id number]     Arrival date & time 08/30/13  2351 History   First MD Initiated Contact with Patient 08/30/13 2354     Chief Complaint  Patient presents with  . Back Pain     (Consider location/radiation/quality/duration/timing/severity/associated sxs/prior Treatment) HPI Hx per PT - BIB EMS in police custody, reportedly was resisting arrest, now is complaining of "pain all over" including LBP, R knee pain and forehead pain with abrasion, no LOC, no neck pain, no vision complaints, no CP or trouble breathing. No weakness, numbness, or incontinence of bowel or bladder. He is able to ambulate. No knee swelling.   Past Medical History  Diagnosis Date  . Hypertension   . Depression   . Back pain   . Asthma    Past Surgical History  Procedure Laterality Date  . Hand tendon surgery     History reviewed. No pertinent family history. History  Substance Use Topics  . Smoking status: Current Every Day Smoker  . Smokeless tobacco: Not on file  . Alcohol Use: Yes    Review of Systems  Constitutional: Negative for fever and chills.  Eyes: Negative for visual disturbance.  Respiratory: Negative for shortness of breath.   Cardiovascular: Negative for chest pain.  Gastrointestinal: Negative for vomiting and abdominal pain.  Genitourinary: Negative for dysuria.  Musculoskeletal: Positive for back pain.  Skin: Positive for wound. Negative for rash.  Neurological: Negative for weakness and numbness.  All other systems reviewed and are negative.     Allergies  Nsaids  Home Medications   Prior to Admission medications   Medication Sig Start Date End Date Taking? Authorizing Provider  acetaminophen (TYLENOL) 500 MG tablet Take 1 tablet (500 mg total) by mouth every 6 (six) hours as needed. 08/12/13   Emilia BeckKaitlyn Szekalski, PA-C  albuterol (PROVENTIL HFA;VENTOLIN HFA) 108 (90 BASE) MCG/ACT inhaler Inhale 2 puffs into the lungs every 6 (six) hours as needed for wheezing. 12/12/12    Russella DarAllison L Ellis, NP  QUEtiapine (SEROQUEL) 50 MG tablet Take 3 tablets (150 mg total) by mouth 2 (two) times daily. 12/12/12   Russella DarAllison L Ellis, NP  traMADol (ULTRAM) 50 MG tablet Take 1 tablet (50 mg total) by mouth every 6 (six) hours as needed. 08/12/13   Emilia BeckKaitlyn Szekalski, PA-C  trimethoprim-polymyxin b (POLYTRIM) ophthalmic solution Place 1 drop into the left eye every 4 (four) hours. 08/12/13   Kaitlyn Szekalski, PA-C   There were no vitals taken for this visit. Physical Exam  Constitutional: He is oriented to person, place, and time. He appears well-developed and well-nourished.  HENT:  Head: Normocephalic.  Small area of abrasion and erythema over left eyebrow, no significant swelling, no ecchymosis, no bony tenderness or deformity  Eyes: EOM are normal. Pupils are equal, round, and reactive to light.  Neck: Neck supple. No tracheal deviation present.  No cervical spine tenderness or deformity  Cardiovascular: Normal rate, regular rhythm and intact distal pulses.   Pulmonary/Chest: Effort normal and breath sounds normal. No stridor. No respiratory distress. He exhibits no tenderness.  Abdominal: Soft. Bowel sounds are normal. He exhibits no distension. There is no tenderness.  Musculoskeletal: Normal range of motion. He exhibits no edema.  No thoracic, lumbar or sacral tenderness/ deformity. RLE with abrasion over right knee, no laceration, no deformity, no edema, no ecchymosis. Distal N/V intact x 4 with good ROM at all major joints.   Neurological: He is alert and oriented to person, place, and time. No cranial nerve deficit.  Speech clear, no focal deficits  Skin: Skin is warm and dry.    ED Course  Procedures (including critical care time) Labs Review Labs Reviewed - No data to display  Imaging Review No results found.  Tetanus UTD 2013, wound care provided  Plan discharge in custody of police. No indication for further ER work up or imaging at this time. Stable and  appropriate for discharge.   MDM   Dx: Multiple abrasions  BIB EMS and police after resisting arrest and sustained abrasions to forehead, and R knee R knee wound irrigated and bandaged. No indication for Ct brain or imaging otherwise.  VS and nurses notes reviewed and considered      Sunnie NielsenBrian Jarred Purtee, MD 08/31/13 [phone]

## 2013-08-30 NOTE — ED Notes (Signed)
Patient was under arrest and was called to the house and refused to leave the area. The [employer] had to use force to get the patient off properity. The patient reports that he is having neck, back pain.

## 2013-08-31 NOTE — ED Notes (Signed)
Patient has minor abrasion to his right knee, and left forehead

## 2013-08-31 NOTE — Discharge Instructions (Signed)
Abrasion Rest, apply ice to areas of injury, and take motrin as needed for any discomfort.  Follow up with your physician in one week.    An abrasion is a cut or scrape of the skin. Abrasions do not extend through all layers of the skin and most heal within 10 days. It is important to care for your abrasion properly to prevent infection. CAUSES  Most abrasions are caused by falling on, or gliding across, the ground or other surface. When your skin rubs on something, the outer and inner layer of skin rubs off, causing an abrasion. DIAGNOSIS  Your caregiver will be able to diagnose an abrasion during a physical exam.  TREATMENT  Your treatment depends on how large and deep the abrasion is. Generally, your abrasion will be cleaned with water and a mild soap to remove any dirt or debris. An antibiotic ointment may be put over the abrasion to prevent an infection. A bandage (dressing) may be wrapped around the abrasion to keep it from getting dirty.  You may need a tetanus shot if:  You cannot remember when you had your last tetanus shot.  You have never had a tetanus shot.  The injury broke your skin. If you get a tetanus shot, your arm may swell, get red, and feel warm to the touch. This is common and not a problem. If you need a tetanus shot and you choose not to have one, there is a rare chance of getting tetanus. Sickness from tetanus can be serious.  HOME CARE INSTRUCTIONS   If a dressing was applied, change it at least once a day or as directed by your caregiver. If the bandage sticks, soak it off with warm water.   Wash the area with water and a mild soap to remove all the ointment 2 times a day. Rinse off the soap and pat the area dry with a clean towel.   Reapply any ointment as directed by your caregiver. This will help prevent infection and keep the bandage from sticking. Use gauze over the wound and under the dressing to help keep the bandage from sticking.   Change your dressing  right away if it becomes wet or dirty.   Only take over-the-counter or prescription medicines for pain, discomfort, or fever as directed by your caregiver.   Follow up with your caregiver within 24 48 hours for a wound check, or as directed. If you were not given a wound-check appointment, look closely at your abrasion for redness, swelling, or pus. These are signs of infection. SEEK IMMEDIATE MEDICAL CARE IF:   You have increasing pain in the wound.   You have redness, swelling, or tenderness around the wound.   You have pus coming from the wound.   You have a fever or persistent symptoms for more than 2 3 days.  You have a fever and your symptoms suddenly get worse.  You have a bad smell coming from the wound or dressing.  MAKE SURE YOU:   Understand these instructions.  Will watch your condition.  Will get help right away if you are not doing well or get worse. Document Released: 01/25/2005 Document Revised: 04/03/2012 Document Reviewed: 03/21/2011 [facility] Patient Information 2014 [place], [place].

## 2014-12-16 ENCOUNTER — Emergency Department (HOSPITAL_COMMUNITY): Payer: Medicaid Other

## 2014-12-16 ENCOUNTER — Encounter (HOSPITAL_COMMUNITY): Payer: Self-pay | Admitting: Emergency Medicine

## 2014-12-16 ENCOUNTER — Emergency Department (HOSPITAL_COMMUNITY)
Admission: EM | Admit: 2014-12-16 | Discharge: 2014-12-16 | Disposition: A | Discharge status: Home / Self Care | Payer: Medicaid Other | Attending: Emergency Medicine | Admitting: Emergency Medicine

## 2014-12-16 DIAGNOSIS — W28XXXA Contact with powered lawn mower, initial encounter: Secondary | ICD-10-CM | POA: Insufficient documentation

## 2014-12-16 DIAGNOSIS — F329 Major depressive disorder, single episode, unspecified: Secondary | ICD-10-CM | POA: Insufficient documentation

## 2014-12-16 DIAGNOSIS — Z79899 Other long term (current) drug therapy: Secondary | ICD-10-CM | POA: Diagnosis not present

## 2014-12-16 DIAGNOSIS — Y998 Other external cause status: Secondary | ICD-10-CM | POA: Insufficient documentation

## 2014-12-16 DIAGNOSIS — S60031A Contusion of right middle finger without damage to nail, initial encounter: Secondary | ICD-10-CM | POA: Diagnosis not present

## 2014-12-16 DIAGNOSIS — Y9289 Other specified places as the place of occurrence of the external cause: Secondary | ICD-10-CM | POA: Diagnosis not present

## 2014-12-16 DIAGNOSIS — S62632A Displaced fracture of distal phalanx of right middle finger, initial encounter for closed fracture: Secondary | ICD-10-CM | POA: Insufficient documentation

## 2014-12-16 DIAGNOSIS — S62634A Displaced fracture of distal phalanx of right ring finger, initial encounter for closed fracture: Secondary | ICD-10-CM | POA: Diagnosis not present

## 2014-12-16 DIAGNOSIS — Z720 Tobacco use: Secondary | ICD-10-CM | POA: Diagnosis not present

## 2014-12-16 DIAGNOSIS — Y9389 Activity, other specified: Secondary | ICD-10-CM | POA: Insufficient documentation

## 2014-12-16 DIAGNOSIS — J45909 Unspecified asthma, uncomplicated: Secondary | ICD-10-CM | POA: Diagnosis not present

## 2014-12-16 DIAGNOSIS — I10 Essential (primary) hypertension: Secondary | ICD-10-CM | POA: Diagnosis not present

## 2014-12-16 DIAGNOSIS — S62639A Displaced fracture of distal phalanx of unspecified finger, initial encounter for closed fracture: Secondary | ICD-10-CM

## 2014-12-16 DIAGNOSIS — S6991XA Unspecified injury of right wrist, hand and finger(s), initial encounter: Secondary | ICD-10-CM | POA: Diagnosis present

## 2014-12-16 MED ORDER — HYDROCODONE-ACETAMINOPHEN 5-325 MG PO TABS
1.0000 | ORAL_TABLET | Freq: Once | ORAL | Status: AC
  Administered 2014-12-16: 1 via ORAL
  Filled 2014-12-16: qty 1

## 2014-12-16 MED ORDER — OXYCODONE-ACETAMINOPHEN 5-325 MG PO TABS: 1.0000 | ORAL_TABLET | ORAL | Status: DC | PRN

## 2014-12-16 NOTE — Discharge Instructions (Signed)
You have injured the tip of your fingers on your right hand.  Wear finger splint for support at least a month.  Take pain medication as needed.  Follow instruction below.    Fingertip Injuries and Amputations Fingertip injuries are common and often get injured because they are last to escape when pulling your hand out of harm's way. You have amputated (cut off) part of your finger. How this turns out depends largely on how much was amputated. If just the tip is amputated, often the end of the finger will grow back and the finger may return to much the same as it was before the injury.  If more of the finger is missing, your caregiver has done the best with the tissue remaining to allow you to keep as much finger as is possible. Your caregiver after checking your injury has tried to leave you with a painless fingertip that has durable, feeling skin. If possible, your caregiver has tried to maintain the finger's length and appearance and preserve its fingernail.  Please read the instructions outlined below and refer to this sheet in the next few weeks. These instructions provide you with general information on caring for yourself. Your caregiver may also give you specific instructions. While your treatment has been done according to the most current medical practices available, unavoidable complications occasionally occur. If you have any problems or questions after discharge, please call your caregiver. HOME CARE INSTRUCTIONS   You may resume normal diet and activities as directed or allowed.  Keep your hand elevated above the level of your heart. This helps decrease pain and swelling.  Keep ice packs (or a bag of ice wrapped in a towel) on the injured area for 15-20 minutes, 03-04 times per day, for the first two days.  Change dressings if necessary or as directed.  Clean the wound daily or as directed.  Only take over-the-counter or prescription medicines for pain, discomfort, or fever as directed  by your caregiver.  Keep appointments as directed. SEEK IMMEDIATE MEDICAL CARE IF:  You develop redness, swelling, numbness or increasing pain in the wound.  There is pus coming from the wound.  You develop an unexplained oral temperature above 102 F (38.9 C) or as your caregiver suggests.  There is a foul (bad) smell coming from the wound or dressing.  There is a breaking open of the wound (edges not staying together) after sutures or staples have been removed. MAKE SURE YOU:   Understand these instructions.  Will watch your condition.  Will get help right away if you are not doing well or get worse. Document Released: 03/08/2005 Document Revised: 07/10/2011 Document Reviewed: 02/05/2008 [facility] Patient Information 2015 [place], [place]. This information is not intended to replace advice given to you by your health care provider. Make sure you discuss any questions you have with your health care provider.

## 2014-12-16 NOTE — ED Notes (Signed)
Pt fell and injured right hand middle  3rd finger and 4th finger.Marland Kitchen He also hit his left shin. Pt's finger are swollen, painful, and pain with wiggling. 10/10 pain. She states his hand went up into a lawn mower. He states it got caught up in the blade. Only a small laceration noted on the end of 4th digit, it is healed appearing. He states he has been using peroxide. He states he cannot stand the pain any more.

## 2014-12-16 NOTE — ED Provider Notes (Signed)
CSN: [id number]     Arrival date & time 12/16/14  7829 History  This chart was scribed for non-physician practitioner, Fayrene Helper, PA-C, working with Eber Hong, MD by Charline Bills, ED Scribe. This patient was seen in room TR06C/TR06C and the patient's care was started at 10:38 AM.   Chief Complaint  Patient presents with  . Hand Injury   The history is provided by the patient. No language interpreter was used.   HPI Comments: Xavier [name] is a 54 y.o. male, with a h/o HTN, who presents to the Emergency Department complaining of a right hand injury sustained 2 days ago. Pt states that he was fixing a running lawnmower 2 days ago when his glove got caught in the lawnmower's blade. He reports pain, bruising and swelling to his right third and forth fingers. Pt describes pain as a constant sharp sensation that is exacerbated with palpation and movement. He has tried 1 Percocet tablet daily for 2 days and peroxide without significant relief. Pt is R hand dominant.   Past Medical History  Diagnosis Date  . Hypertension   . Depression   . Back pain   . Asthma    Past Surgical History  Procedure Laterality Date  . Hand tendon surgery     History reviewed. No pertinent family history. Social History  Substance Use Topics  . Smoking status: Current Every Day Smoker  . Smokeless tobacco: None  . Alcohol Use: Yes    Review of Systems  Musculoskeletal: Positive for joint swelling and arthralgias.  Skin: Positive for color change.   Allergies  Nsaids  Home Medications   Prior to Admission medications   Medication Sig Start Date End Date Taking? Authorizing Provider  acetaminophen (TYLENOL) 500 MG tablet Take 1 tablet (500 mg total) by mouth every 6 (six) hours as needed. 08/12/13   Emilia Beck, PA-C  albuterol (PROVENTIL HFA;VENTOLIN HFA) 108 (90 BASE) MCG/ACT inhaler Inhale 2 puffs into the lungs every 6 (six) hours as needed for wheezing. 12/12/12   Russella Dar, NP   QUEtiapine (SEROQUEL) 50 MG tablet Take 3 tablets (150 mg total) by mouth 2 (two) times daily. 12/12/12   Russella Dar, NP  traMADol (ULTRAM) 50 MG tablet Take 1 tablet (50 mg total) by mouth every 6 (six) hours as needed. 08/12/13   Emilia Beck, PA-C  trimethoprim-polymyxin b (POLYTRIM) ophthalmic solution Place 1 drop into the left eye every 4 (four) hours. 08/12/13   Kaitlyn Szekalski, PA-C   BP 101/76 mmHg  Temp(Src) 98.4 F (36.9 C) (Oral)  Resp 16  Wt 144 lb 6.4 oz (65.499 kg)  SpO2 98% Physical Exam  Constitutional: He is oriented to person, place, and time. He appears well-developed and well-nourished. No distress.  HENT:  Head: Normocephalic and atraumatic.  Eyes: Conjunctivae and EOM are normal.  Neck: Neck supple. No tracheal deviation present.  Cardiovascular: Normal rate.   Pulmonary/Chest: Effort normal. No respiratory distress.  Musculoskeletal: Normal range of motion.  R hand: exquisite tenderness noted to tip of 3rd and 4th fingers on palpation with associated swelling. Bruising noted to tip of 3rd finger. No nail involvement. Sensation intact.   Neurological: He is alert and oriented to person, place, and time.  Skin: Skin is warm and dry.  Psychiatric: He has a normal mood and affect. His behavior is normal.  Nursing note and vitals reviewed.  ED Course  Procedures (including critical care time) DIAGNOSTIC STUDIES: Oxygen Saturation is 98% on RA, normal  by my interpretation.    COORDINATION OF CARE: 10:42 AM-Discussed treatment plan which includes XR and Norco with pt at bedside and pt agreed to plan.   11:45 AM X-ray of right hand demonstrate evidence of toe fracture to the third and fourth fingers. This is a closed injury. Finger splint applied for protection. Rice therapy discussed. Pain medication prescribed. Patient can follow-up with hand specialist as needed. Patient otherwise neurovascularly intact and there is no joint involvement. Labs  Review Labs Reviewed - No data to display  Imaging Review Dg Hand Complete Right  12/16/2014   CLINICAL DATA:  53 year old male with right third and fourth finger pain, injury from lawnmower blade 2 days ago.  EXAM: RIGHT HAND - COMPLETE 3+ VIEW  COMPARISON:  Right thumb series 4 /27/2012  FINDINGS: There are mildly comminuted oblique fractures of the tufts of the third and fourth distal phalanges. Mild displacement of the third tuft fracture. Minimally displaced fourth tuft fracture. The distal IP joints and other joint spaces in the right hand appear intact in within normal limits. Other phalanges appear intact. No subcutaneous gas identified. Ring artifact on the fifth finger, no radiopaque foreign body identified.  Chronic appearing deformity of the distal right ulna.  IMPRESSION: Mildly comminuted and displaced fractures of the third and fourth distal phalanx tufts.   Electronically Signed   By: Odessa Fleming M.D.   On: 12/16/2014 11:24   I have personally reviewed and evaluated these images and lab results as part of my medical decision-making.   EKG Interpretation None      MDM   Final diagnoses:  Closed fracture of tuft of distal phalanx of finger, initial encounter  Hand injuries, right, initial encounter    BP 104/72 mmHg  Pulse 64  Temp(Src) 98.4 F (36.9 C) (Oral)  Resp 16  Wt 144 lb 6.4 oz (65.499 kg)  SpO2 100%  I have reviewed nursing notes and vital signs. I personally viewed the imaging tests through PACS system and agrees with radiologist's intepretation I reviewed available ER/hospitalization records through the EMR   I personally performed the services described in this documentation, which was scribed in my presence. The recorded information has been reviewed and is accurate.     Fayrene Helper, PA-C 12/16/14 1146  Eber Hong, MD 12/16/14 2252

## 2015-01-07 ENCOUNTER — Ambulatory Visit: Payer: Medicaid Other | Admitting: Occupational Therapy

## 2015-04-05 ENCOUNTER — Encounter (HOSPITAL_COMMUNITY): Payer: Self-pay | Admitting: Family Medicine

## 2015-04-05 ENCOUNTER — Emergency Department (HOSPITAL_COMMUNITY)
Admission: EM | Admit: 2015-04-05 | Discharge: 2015-04-05 | Disposition: A | Discharge status: Home / Self Care | Payer: Medicaid Other | Attending: Emergency Medicine | Admitting: Emergency Medicine

## 2015-04-05 DIAGNOSIS — W19XXXA Unspecified fall, initial encounter: Secondary | ICD-10-CM

## 2015-04-05 DIAGNOSIS — W010XXA Fall on same level from slipping, tripping and stumbling without subsequent striking against object, initial encounter: Secondary | ICD-10-CM | POA: Diagnosis not present

## 2015-04-05 DIAGNOSIS — Z79899 Other long term (current) drug therapy: Secondary | ICD-10-CM | POA: Diagnosis not present

## 2015-04-05 DIAGNOSIS — F329 Major depressive disorder, single episode, unspecified: Secondary | ICD-10-CM | POA: Diagnosis not present

## 2015-04-05 DIAGNOSIS — S3992XA Unspecified injury of lower back, initial encounter: Secondary | ICD-10-CM | POA: Diagnosis present

## 2015-04-05 DIAGNOSIS — Y9289 Other specified places as the place of occurrence of the external cause: Secondary | ICD-10-CM | POA: Diagnosis not present

## 2015-04-05 DIAGNOSIS — J069 Acute upper respiratory infection, unspecified: Secondary | ICD-10-CM | POA: Diagnosis not present

## 2015-04-05 DIAGNOSIS — M545 Low back pain, unspecified: Secondary | ICD-10-CM

## 2015-04-05 DIAGNOSIS — Y990 Civilian activity done for income or pay: Secondary | ICD-10-CM | POA: Diagnosis not present

## 2015-04-05 DIAGNOSIS — I10 Essential (primary) hypertension: Secondary | ICD-10-CM | POA: Insufficient documentation

## 2015-04-05 DIAGNOSIS — F17200 Nicotine dependence, unspecified, uncomplicated: Secondary | ICD-10-CM | POA: Diagnosis not present

## 2015-04-05 DIAGNOSIS — Y9389 Activity, other specified: Secondary | ICD-10-CM | POA: Diagnosis not present

## 2015-04-05 DIAGNOSIS — J45909 Unspecified asthma, uncomplicated: Secondary | ICD-10-CM | POA: Insufficient documentation

## 2015-04-05 MED ORDER — PREDNISONE 20 MG PO TABS: 40.0000 mg | ORAL_TABLET | Freq: Every day | ORAL | Status: DC

## 2015-04-05 MED ORDER — OXYCODONE-ACETAMINOPHEN 5-325 MG PO TABS: 1.0000 | ORAL_TABLET | ORAL | Status: DC | PRN

## 2015-04-05 MED ORDER — ALBUTEROL SULFATE HFA 108 (90 BASE) MCG/ACT IN AERS: 2.0000 | INHALATION_SPRAY | Freq: Four times a day (QID) | RESPIRATORY_TRACT | Status: DC | PRN

## 2015-04-05 MED ORDER — QUETIAPINE FUMARATE 50 MG PO TABS: 150.0000 mg | ORAL_TABLET | Freq: Two times a day (BID) | ORAL | Status: AC

## 2015-04-05 NOTE — ED Provider Notes (Signed)
CSN: [id number]     Arrival date & time 04/05/15  1146 History  By signing my name below, I, Sonum Patel, attest that this documentation has been prepared under the direction and in the presence of Arthor Captain, PA-C. Electronically Signed: Sonum Patel, Neurosurgeon. 04/05/2015. 12:49 PM.    Chief Complaint  Patient presents with  . Back Pain  . URI    The history is provided by the patient. No language interpreter was used.    HPI Comments: Xavier [name] is a 54 y.o. male who presents to the Emergency Department complaining of 1 week of generalized myalgias with associated subjective fever, sore throat, and cough. He reports working in the rain prior to onset of symptoms. He also states he had a fall last week from a height of 5'. He states the pain is worse with movement. He denies drug or alcohol abuse.   Past Medical History  Diagnosis Date  . Hypertension   . Depression   . Back pain   . Asthma    Past Surgical History  Procedure Laterality Date  . Hand tendon surgery     History reviewed. No pertinent family history. Social History  Substance Use Topics  . Smoking status: Current Every Day Smoker  . Smokeless tobacco: None  . Alcohol Use: Yes    Review of Systems  Constitutional: Positive for fever (subjective).  HENT: Positive for sore throat.   Respiratory: Positive for cough.   Musculoskeletal: Positive for myalgias and back pain.  Neurological: Negative for weakness and numbness.   Allergies  Nsaids  Home Medications   Prior to Admission medications   Medication Sig Start Date End Date Taking? Authorizing Provider  acetaminophen (TYLENOL) 500 MG tablet Take 1 tablet (500 mg total) by mouth every 6 (six) hours as needed. 08/12/13   Emilia Beck, PA-C  albuterol (PROVENTIL HFA;VENTOLIN HFA) 108 (90 BASE) MCG/ACT inhaler Inhale 2 puffs into the lungs every 6 (six) hours as needed for wheezing. 12/12/12   Russella Dar, NP  oxyCODONE-acetaminophen  (PERCOCET/ROXICET) 5-325 MG per tablet Take 1 tablet by mouth every 4 (four) hours as needed for severe pain. 12/16/14   Fayrene Helper, PA-C  QUEtiapine (SEROQUEL) 50 MG tablet Take 3 tablets (150 mg total) by mouth 2 (two) times daily. 12/12/12   Russella Dar, NP  traMADol (ULTRAM) 50 MG tablet Take 1 tablet (50 mg total) by mouth every 6 (six) hours as needed. 08/12/13   Emilia Beck, PA-C  trimethoprim-polymyxin b (POLYTRIM) ophthalmic solution Place 1 drop into the left eye every 4 (four) hours. 08/12/13   Kaitlyn Szekalski, PA-C   BP 127/90 mmHg  Pulse 70  Temp(Src) 98 F (36.7 C) (Oral)  Resp 18  SpO2 98% Physical Exam Physical Exam  Constitutional: Pt appears well-developed and well-nourished. No distress.  HENT:  Head: Normocephalic and atraumatic.  Mouth/Throat: Oropharynx is clear and moist. No oropharyngeal exudate.  Eyes: Conjunctivae are normal.  Neck: Normal range of motion. Neck supple.  Full ROM without pain  Cardiovascular: Normal rate, regular rhythm and intact distal pulses.   Pulmonary/Chest: Effort normal and breath sounds normal. No respiratory distress. Pt has no wheezes.  Abdominal: Soft. Pt exhibits no distension. There is no tenderness.  Musculoskeletal:  Full range of motion of the T-spine and L-spine No tenderness to palpation of the spinous processes of the T-spine or L-spine Mild tenderness to palpation of the paraspinous muscles of the L-spine  Lymphadenopathy:    Pt has no  cervical adenopathy.  Neurological: Pt is alert. Pt has normal reflexes.  Reflex Scores:      Bicep reflexes are 2+ on the right side and 2+ on the left side.      Brachioradialis reflexes are 2+ on the right side and 2+ on the left side.      Patellar reflexes are 2+ on the right side and 2+ on the left side.      Achilles reflexes are 2+ on the right side and 2+ on the left side. Speech is clear and goal oriented, follows commands Normal 5/5 strength in upper and lower  extremities bilaterally including dorsiflexion and plantar flexion, strong and equal grip strength Sensation normal to light and sharp touch Moves extremities without ataxia, coordination intact Normal gait Normal balance No Clonus Skin: Skin is warm and dry. No rash noted. Pt is not diaphoretic. No erythema.  Psychiatric: Pt has a normal mood and affect. Behavior is normal.  Nursing note and vitals reviewed.   ED Course  Procedures (including critical care time)  DIAGNOSTIC STUDIES: Oxygen Saturation is 98% on RA, normal by my interpretation.    COORDINATION OF CARE: 12:54 PM Discussed treatment plan with pt at bedside and pt agreed to plan.   Labs Review Labs Reviewed - No data to display  Imaging Review No results found.    EKG Interpretation None      MDM   Final diagnoses:  Bilateral low back pain without sciatica  Fall, initial encounter  URI (upper respiratory infection)    Patient with back pain.  No neurological deficits and normal neuro exam.  Patient is ambulatory.  No loss of bowel or bladder control.  No concern for cauda equina.  No fever, night sweats, weight loss, h/o cancer, IVDA, no recent procedure to back. No urinary symptoms suggestive of UTI.  Supportive care and return precaution discussed. Appears safe for discharge at this time. Follow up as indicated in discharge paperwork.    Pt symptoms consistent with URI. Pt will be discharged with symptomatic treatment.  Discussed return precautions.  Pt is hemodynamically stable & in NAD prior to discharge.   I personally performed the services described in this documentation, which was scribed in my presence. The recorded information has been reviewed and is accurate.       Arthor Captainbigail Breckon Reeves, PA-C 04/05/15 1308  Laurence Spatesachel Morgan Little, MD 04/05/15 [phone]

## 2015-04-05 NOTE — ED Notes (Signed)
Declined W/C at D/C and was escorted to lobby by RN. 

## 2015-04-05 NOTE — ED Notes (Signed)
Pt here back pain after slip and fall last week at work. sts also cold like symptoms, sore throat, cough.

## 2015-04-05 NOTE — ED Notes (Addendum)
SEE PA assesments.

## 2015-04-05 NOTE — Discharge Instructions (Signed)
SEEK IMMEDIATE MEDICAL ATTENTION IF: New numbness, tingling, weakness, or problem with the use of your arms or legs.  Severe back pain not relieved with medications.  Change in bowel or bladder control.  Increasing pain in any areas of the body (such as chest or abdominal pain).  Shortness of breath, dizziness or fainting.  Nausea (feeling sick to your stomach), vomiting, fever, or sweats.  You appear to have an upper respiratory infection (URI). An upper respiratory tract infection, or cold, is a viral infection of the air passages leading to the lungs. It is contagious and can be spread to others, especially during the first 3 or 4 days. It cannot be cured by antibiotics or other medicines. RETURN IMMEDIATELY IF you develop shortness of breath, confusion or altered mental status, a new rash, become dizzy, faint, or poorly responsive, or are unable to be cared for at home.  Back Injury Prevention Back injuries can be very painful. They can also be difficult to heal. After having one back injury, you are more likely to injure your back again. It is important to learn how to avoid injuring or re-injuring your back. The following tips can help you to prevent a back injury. WHAT SHOULD I KNOW ABOUT PHYSICAL FITNESS?  Exercise for 30 minutes per day on most days of the week or as directed by your health care provider. Make sure to:  Do aerobic exercises, such as walking, jogging, biking, or swimming.  Do exercises that increase balance and strength, such as tai chi and yoga. These can decrease your risk of falling and injuring your back.  Do stretching exercises to help with flexibility.  Try to develop strong abdominal muscles. Your abdominal muscles provide a lot of the support that is needed by your back.  Maintain a healthy weight. This helps to decrease your risk of a back injury. WHAT SHOULD I KNOW ABOUT MY DIET?  Talk with your health care provider about your overall diet. Take  supplements and vitamins only as directed by your health care provider.  Talk with your health care provider about how much calcium and vitamin D you need each day. These nutrients help to prevent weakening of the bones (osteoporosis). Osteoporosis can cause broken (fractured) bones, which lead to back pain.  Include good sources of calcium in your diet, such as dairy products, green leafy vegetables, and products that have had calcium added to them (fortified).  Include good sources of vitamin D in your diet, such as milk and foods that are fortified with vitamin D. WHAT SHOULD I KNOW ABOUT MY POSTURE?  Sit up straight and stand up straight. Avoid leaning forward when you sit or hunching over when you stand.  Choose chairs that have good low-back (lumbar) support.  If you work at a desk, sit close to it so you do not need to lean over. Keep your chin tucked in. Keep your neck drawn back, and keep your elbows bent at a right angle. Your arms should look like the letter "L."  Sit high and close to the steering wheel when you drive. Add a lumbar support to your car seat, if needed.  Avoid sitting or standing in one position for very long. Take breaks to get up, stretch, and walk around at least one time every hour. Take breaks every hour if you are driving for long periods of time.  Sleep on your side with your knees slightly bent, or sleep on your back with a pillow under your  knees. Do not lie on the front of your body to sleep. WHAT SHOULD I KNOW ABOUT LIFTING, TWISTING, AND REACHING? Lifting and Heavy Lifting  Avoid heavy lifting, especially repetitive heavy lifting. If you must do heavy lifting:  Stretch before lifting.  Work slowly.  Rest between lifts.  Use a tool such as a cart or a dolly to move objects if one is available.  Make several small trips instead of carrying one heavy load.  Ask for help when you need it, especially when moving big objects.  Follow these steps  when lifting:  Stand with your feet shoulder-width apart.  Get as close to the object as you can. Do not try to pick up a heavy object that is far from your body.  Use handles or lifting straps if they are available.  Bend at your knees. Squat down, but keep your heels off the floor.  Keep your shoulders pulled back, your chin tucked in, and your back straight.  Lift the object slowly while you tighten the muscles in your legs, abdomen, and buttocks. Keep the object as close to the center of your body as possible.  Follow these steps when putting down a heavy load:  Stand with your feet shoulder-width apart.  Lower the object slowly while you tighten the muscles in your legs, abdomen, and buttocks. Keep the object as close to the center of your body as possible.  Keep your shoulders pulled back, your chin tucked in, and your back straight.  Bend at your knees. Squat down, but keep your heels off the floor.  Use handles or lifting straps if they are available. Twisting and Reaching  Avoid lifting heavy objects above your waist.  Do not twist at your waist while you are lifting or carrying a load. If you need to turn, move your feet.  Do not bend over without bending at your knees.  Avoid reaching over your head, across a table, or for an object on a high surface. WHAT ARE SOME OTHER TIPS?  Avoid wet floors and icy ground. Keep sidewalks clear of ice to prevent falls.  Do not sleep on a mattress that is too soft or too hard.  Keep items that are used frequently within easy reach.  Put heavier objects on shelves at waist level, and put lighter objects on lower or higher shelves.  Find ways to decrease your stress, such as exercise, massage, or relaxation techniques. Stress can build up in your muscles. Tense muscles are more vulnerable to injury.  Talk with your health care provider if you feel anxious or depressed. These conditions can make back pain worse.  Wear flat  heel shoes with cushioned soles.  Avoid sudden movements.  Use both shoulder straps when carrying a backpack.  Do not use any tobacco products, including cigarettes, chewing tobacco, or electronic cigarettes. If you need help quitting, ask your health care provider.   This information is not intended to replace advice given to you by your health care provider. Make sure you discuss any questions you have with your health care provider.   Document Released: 05/25/2004 Document Revised: 09/01/2014 Document Reviewed: 04/21/2014 Elsevier Interactive Patient Education 2016 Elsevier Inc.  Cough, Adult Coughing is a reflex that clears your throat and your airways. Coughing helps to heal and protect your lungs. It is normal to cough occasionally, but a cough that happens with other symptoms or lasts a long time may be a sign of a condition that needs treatment. A  cough may last only 2-3 weeks (acute), or it may last longer than 8 weeks (chronic). CAUSES Coughing is commonly caused by:  Breathing in substances that irritate your lungs.  A viral or bacterial respiratory infection.  Allergies.  Asthma.  Postnasal drip.  Smoking.  Acid backing up from the stomach into the esophagus (gastroesophageal reflux).  Certain medicines.  Chronic lung problems, including COPD (or rarely, lung cancer).  Other medical conditions such as heart failure. HOME CARE INSTRUCTIONS  Pay attention to any changes in your symptoms. Take these actions to help with your discomfort:  Take medicines only as told by your health care provider.  If you were prescribed an antibiotic medicine, take it as told by your health care provider. Do not stop taking the antibiotic even if you start to feel better.  Talk with your health care provider before you take a cough suppressant medicine.  Drink enough fluid to keep your urine clear or pale yellow.  If the air is dry, use a cold steam vaporizer or humidifier in  your bedroom or your home to help loosen secretions.  Avoid anything that causes you to cough at work or at home.  If your cough is worse at night, try sleeping in a semi-upright position.  Avoid cigarette smoke. If you smoke, quit smoking. If you need help quitting, ask your health care provider.  Avoid caffeine.  Avoid alcohol.  Rest as needed. SEEK MEDICAL CARE IF:   You have new symptoms.  You cough up pus.  Your cough does not get better after 2-3 weeks, or your cough gets worse.  You cannot control your cough with suppressant medicines and you are losing sleep.  You develop pain that is getting worse or pain that is not controlled with pain medicines.  You have a fever.  You have unexplained weight loss.  You have night sweats. SEEK IMMEDIATE MEDICAL CARE IF:  You cough up blood.  You have difficulty breathing.  Your heartbeat is very fast.   This information is not intended to replace advice given to you by your health care provider. Make sure you discuss any questions you have with your health care provider.   Document Released: 10/14/2010 Document Revised: 01/06/2015 Document Reviewed: 06/24/2014 Elsevier Interactive Patient Education [employer].

## 2015-06-25 ENCOUNTER — Emergency Department (HOSPITAL_COMMUNITY): Payer: Medicaid Other

## 2015-06-25 ENCOUNTER — Emergency Department (HOSPITAL_COMMUNITY)
Admission: EM | Admit: 2015-06-25 | Discharge: 2015-06-25 | Disposition: A | Discharge status: Home / Self Care | Payer: Medicaid Other | Attending: Emergency Medicine | Admitting: Emergency Medicine

## 2015-06-25 ENCOUNTER — Encounter (HOSPITAL_COMMUNITY): Payer: Self-pay | Admitting: *Deleted

## 2015-06-25 DIAGNOSIS — W11XXXA Fall on and from ladder, initial encounter: Secondary | ICD-10-CM | POA: Insufficient documentation

## 2015-06-25 DIAGNOSIS — W19XXXA Unspecified fall, initial encounter: Secondary | ICD-10-CM

## 2015-06-25 DIAGNOSIS — Y9389 Activity, other specified: Secondary | ICD-10-CM | POA: Insufficient documentation

## 2015-06-25 DIAGNOSIS — Z79899 Other long term (current) drug therapy: Secondary | ICD-10-CM | POA: Insufficient documentation

## 2015-06-25 DIAGNOSIS — F329 Major depressive disorder, single episode, unspecified: Secondary | ICD-10-CM | POA: Insufficient documentation

## 2015-06-25 DIAGNOSIS — Y998 Other external cause status: Secondary | ICD-10-CM | POA: Diagnosis not present

## 2015-06-25 DIAGNOSIS — M545 Low back pain, unspecified: Secondary | ICD-10-CM

## 2015-06-25 DIAGNOSIS — J45909 Unspecified asthma, uncomplicated: Secondary | ICD-10-CM | POA: Insufficient documentation

## 2015-06-25 DIAGNOSIS — K644 Residual hemorrhoidal skin tags: Secondary | ICD-10-CM | POA: Insufficient documentation

## 2015-06-25 DIAGNOSIS — S3992XA Unspecified injury of lower back, initial encounter: Secondary | ICD-10-CM | POA: Insufficient documentation

## 2015-06-25 DIAGNOSIS — F17200 Nicotine dependence, unspecified, uncomplicated: Secondary | ICD-10-CM | POA: Insufficient documentation

## 2015-06-25 DIAGNOSIS — Y9289 Other specified places as the place of occurrence of the external cause: Secondary | ICD-10-CM | POA: Diagnosis not present

## 2015-06-25 DIAGNOSIS — I10 Essential (primary) hypertension: Secondary | ICD-10-CM | POA: Diagnosis not present

## 2015-06-25 MED ORDER — HYDROCORTISONE 2.5 % RE CREA
TOPICAL_CREAM | Freq: Three times a day (TID) | RECTAL | Status: DC
  Administered 2015-06-25: 19:00:00 via RECTAL
  Filled 2015-06-25: qty 28.35

## 2015-06-25 MED ORDER — CYCLOBENZAPRINE HCL 10 MG PO TABS: 5.0000 mg | ORAL_TABLET | Freq: Three times a day (TID) | ORAL | Status: DC | PRN

## 2015-06-25 MED ORDER — IBUPROFEN 200 MG PO TABS
600.0000 mg | ORAL_TABLET | Freq: Four times a day (QID) | ORAL | Status: DC | PRN
  Filled 2015-06-25: qty 1

## 2015-06-25 MED ORDER — HYDROCORTISONE 2.5 % RE CREA: TOPICAL_CREAM | RECTAL | Status: DC

## 2015-06-25 MED ORDER — KETOROLAC TROMETHAMINE 15 MG/ML IJ SOLN
15.0000 mg | Freq: Once | INTRAMUSCULAR | Status: AC
  Administered 2015-06-25: 15 mg via INTRAMUSCULAR
  Filled 2015-06-25: qty 1

## 2015-06-25 NOTE — ED Notes (Signed)
Pt departed in NAD.  

## 2015-06-25 NOTE — ED Notes (Signed)
k-pad cannot be located

## 2015-06-25 NOTE — ED Notes (Addendum)
Pt reports falling one week ago, having lower back pain and hemorrhoid pain since. Also reports being out of all his meds x 1 month and requesting refills.

## 2015-06-25 NOTE — ED Notes (Signed)
C/O LBP AND HEMORRHOIDAL PAIN.

## 2015-06-25 NOTE — ED Provider Notes (Signed)
CSN: [id number]     Arrival date & time 06/25/15  1014 History   First MD Initiated Contact with Patient 06/25/15 1604     Chief Complaint  Patient presents with  . Back Pain  . Hemorrhoids   The history is provided by the patient.   Patient reports that he feel off a ladder as he was coming down off a house.  He notes that he slipped and fell onto some building materials, injuring his low back.  He notes that he was sore afterwards.  Has been using hot showers, bengay, rubbing alcohol and has drank some to help.  He notes that he tried using a back brace that has also not helped.  He notes that pain is sore and achy 9/10 pain.  Pain is intermittent and worsened by walking or exerting himself.  Small amount of weakness since fall.  No falls since initial.  No numbness or tingling.  No urinary retention.  Also thinks that his hemorrhoid is acting up.  He notes that he was seen in the ED previously for it and they pushed it back into his rectum.  He denies rectal bleeding, pain with defecation, constipation or diarrhea.  No fevers, chills, nausea or vomiting.  Has used hemorrhoid cream in the past that has worked well but he is out of this medication.    Patient drinks 24 ounces of beer daily, smokes 1 ppd [place], no drugs.  Stays with family.  Past Medical History  Diagnosis Date  . Hypertension   . Depression   . Back pain   . Asthma    Past Surgical History  Procedure Laterality Date  . Hand tendon surgery     History reviewed. No pertinent family history. Social History  Substance Use Topics  . Smoking status: Current Every Day Smoker  . Smokeless tobacco: None  . Alcohol Use: Yes   Review of Systems  Constitutional: Negative for fever.  Eyes: Negative for photophobia.  Gastrointestinal: Negative for nausea, vomiting, abdominal pain, constipation, blood in stool and anal bleeding.  Genitourinary: Negative for dysuria and hematuria.       Painful hemorrhoid  Musculoskeletal:  Positive for back pain. Negative for neck pain and neck stiffness.  Skin: Negative for wound.  Neurological: Positive for weakness (mild per patient). Negative for dizziness, light-headedness, numbness and headaches.  Psychiatric/Behavioral: Negative for confusion.   Allergies  Nsaids  Home Medications   Prior to Admission medications   Medication Sig Start Date End Date Taking? Authorizing Provider  acetaminophen (TYLENOL) 500 MG tablet Take 1 tablet (500 mg total) by mouth every 6 (six) hours as needed. Patient not taking: Reported on 06/25/2015 08/12/13   Emilia Beck, PA-C  albuterol (PROVENTIL HFA;VENTOLIN HFA) 108 (90 BASE) MCG/ACT inhaler Inhale 2 puffs into the lungs every 6 (six) hours as needed for wheezing. Patient not taking: Reported on 06/25/2015 04/05/15   Arthor Captain, PA-C  oxyCODONE-acetaminophen (PERCOCET/ROXICET) 5-325 MG tablet Take 1 tablet by mouth every 4 (four) hours as needed for severe pain. Patient not taking: Reported on 06/25/2015 04/05/15   Arthor Captain, PA-C  QUEtiapine (SEROQUEL) 50 MG tablet Take 3 tablets (150 mg total) by mouth 2 (two) times daily. Patient not taking: Reported on 06/25/2015 04/05/15   Arthor Captain, PA-C   BP 121/87 mmHg  Pulse 58  Temp(Src) 98 F (36.7 C) (Oral)  Resp 20  Ht  (1.702 m)  Wt 61.236 kg  BMI 21.14 kg/m2  SpO2 100% Physical Exam  Constitutional: He is oriented to person, place, and time. He appears well-developed and well-nourished. No distress.  HENT:  Head: Normocephalic and atraumatic.  Mouth/Throat: Oropharynx is clear and moist. No oropharyngeal exudate.  Eyes: EOM are normal. Pupils are equal, round, and reactive to light.  Neck: Normal range of motion.  Cardiovascular: Normal rate, regular rhythm, normal heart sounds and intact distal pulses.   No murmur heard. Pulmonary/Chest: Effort normal and breath sounds normal. No respiratory distress. He has no wheezes.  Abdominal: Soft. Bowel sounds are  normal. There is no tenderness. There is no rebound.  Genitourinary: Rectal exam shows external hemorrhoid and tenderness (does not appear necrotic or infected).  Musculoskeletal: Normal range of motion. He exhibits no edema.  FROM, 5/5 UE and LE strength, + paraspinal TTP of lumbar spine  Lymphadenopathy:    He has no cervical adenopathy.  Neurological: He is alert and oriented to person, place, and time. He displays normal reflexes. No cranial nerve deficit. He exhibits normal muscle tone. Coordination normal.  Skin: Skin is warm. No rash noted. He is not diaphoretic.  Psychiatric: He has a normal mood and affect.   ED Course  Procedures (including critical care time) Labs Review Labs Reviewed - No data to display  Imaging Review Dg Lumbar Spine Complete  06/25/2015  CLINICAL DATA:  Fall 1 week prior with lumbosacral back pain since that time. EXAM: LUMBAR SPINE - COMPLETE 4+ VIEW COMPARISON:  None. FINDINGS: Trace rightward curvature of the lumbar spine. Vertebral body heights are normal. There is no listhesis. The posterior elements are intact. Disc spaces are preserved. No fracture. Sacroiliac joints are symmetric and normal. IMPRESSION: No acute bony abnormality. Trace rightward curvature may be positional or minimal scoliosis. Electronically Signed   By: Rubye Oaks M.D.   On: 06/25/2015 18:18   I have personally reviewed and evaluated these images and lab results as part of my medical decision-making.   EKG Interpretation None      MDM   Final diagnoses:  Fall   1713: Patient reports that he can take Motrin, Aleve without side effects.  Will order Motrin  x1.  Kpad ordered, L spine xray ordered.  Hemorrhoid does not appear necrotic.  Hemorrhoid cream ordered.  1814: RN reports that patient declines Motrin.  Wanting an opiate for hemorrhoid.  Discussed that this is not appropriate.  Xavier [name] is a 55 y.o. male that presents to ED for back pain and painful  hemorrhoid.  Hemorrhoid with no evidence of strangulation.  Discussed with patient that he will need to continue sitz baths.  Continue hemorrhoid cream and follow up with PCP.  May need referral to gen surg for removal if persistent despite conservative treatment.  Lumbar spine xrays showed no fracture or acute abnormalities.  Toradol  IM administered.  Patient discharged with rx for Flexeril and given a tube of Hemorrhoidal HC cream to use.  Patient was discharged in stable condition with return precautions reviewed.  Patient to follow up with PCP for continued management.    Raliegh Ip, DO 06/25/15 1837  Nelva Nay, MD 06/25/15 [phone]

## 2015-06-25 NOTE — Discharge Instructions (Signed)
You were seen in the emergency department for low back pain and a painful hemorrhoid.  You had back xrays performed that showed no fractures or other acute abnormalities.  Your hemorrhoid does not look infected but it is swollen.  You will need to continue sitz baths and hemorrhoid cream to reduce swelling and pain.  Follow up with your PCP.  You may need to be referred to a general surgeon if it does not go away.   Back Exercises If you have pain in your back, do these exercises 2-3 times each day or as told by your doctor. When the pain goes away, do the exercises once each day, but repeat the steps more times for each exercise (do more repetitions). If you do not have pain in your back, do these exercises once each day or as told by your doctor. EXERCISES Single Knee to Chest Do these steps 3-5 times in a row for each leg:  Lie on your back on a firm bed or the floor with your legs stretched out.  Bring one knee to your chest.  Hold your knee to your chest by grabbing your knee or thigh.  Pull on your knee until you feel a gentle stretch in your lower back.  Keep doing the stretch for 10-30 seconds.  Slowly let go of your leg and straighten it. Pelvic Tilt Do these steps 5-10 times in a row:  Lie on your back on a firm bed or the floor with your legs stretched out.  Bend your knees so they point up to the ceiling. Your feet should be flat on the floor.  Tighten your lower belly (abdomen) muscles to press your lower back against the floor. This will make your tailbone point up to the ceiling instead of pointing down to your feet or the floor.  Stay in this position for 5-10 seconds while you gently tighten your muscles and breathe evenly. Cat-Cow Do these steps until your lower back bends more easily:  Get on your hands and knees on a firm surface. Keep your hands under your shoulders, and keep your knees under your hips. You may put padding under your knees.  Let your head hang  down, and make your tailbone point down to the floor so your lower back is round like the back of a cat.  Stay in this position for 5 seconds.  Slowly lift your head and make your tailbone point up to the ceiling so your back hangs low (sags) like the back of a cow.  Stay in this position for 5 seconds. Press-Ups Do these steps 5-10 times in a row: 1. Lie on your belly (face-down) on the floor. 2. Place your hands near your head, about shoulder-width apart. 3. While you keep your back relaxed and keep your hips on the floor, slowly straighten your arms to raise the top half of your body and lift your shoulders. Do not use your back muscles. To make yourself more comfortable, you may change where you place your hands. 4. Stay in this position for 5 seconds. 5. Slowly return to lying flat on the floor. Bridges Do these steps 10 times in a row: 1. Lie on your back on a firm surface. 2. Bend your knees so they point up to the ceiling. Your feet should be flat on the floor. 3. Tighten your butt muscles and lift your butt off of the floor until your waist is almost as high as your knees. If you do  not feel the muscles working in your butt and the back of your thighs, slide your feet 1-2 inches farther away from your butt. 4. Stay in this position for 3-5 seconds. 5. Slowly lower your butt to the floor, and let your butt muscles relax. If this exercise is too easy, try doing it with your arms crossed over your chest. Belly Crunches Do these steps 5-10 times in a row: 1. Lie on your back on a firm bed or the floor with your legs stretched out. 2. Bend your knees so they point up to the ceiling. Your feet should be flat on the floor. 3. Cross your arms over your chest. 4. Tip your chin a little bit toward your chest but do not bend your neck. 5. Tighten your belly muscles and slowly raise your chest just enough to lift your shoulder blades a tiny bit off of the floor. 6. Slowly lower your chest  and your head to the floor. Back Lifts Do these steps 5-10 times in a row: 1. Lie on your belly (face-down) with your arms at your sides, and rest your forehead on the floor. 2. Tighten the muscles in your legs and your butt. 3. Slowly lift your chest off of the floor while you keep your hips on the floor. Keep the back of your head in line with the curve in your back. Look at the floor while you do this. 4. Stay in this position for 3-5 seconds. 5. Slowly lower your chest and your face to the floor. GET HELP IF:  Your back pain gets a lot worse when you do an exercise.  Your back pain does not lessen 2 hours after you exercise. If you have any of these problems, stop doing the exercises. Do not do them again unless your doctor says it is okay. GET HELP RIGHT AWAY IF:  You have sudden, very bad back pain. If this happens, stop doing the exercises. Do not do them again unless your doctor says it is okay.   This information is not intended to replace advice given to you by your health care provider. Make sure you discuss any questions you have with your health care provider.   Document Released: 05/20/2010 Document Revised: 01/06/2015 Document Reviewed: 06/11/2014 Elsevier Interactive Patient Education 2016 [employer].  Hemorrhoids Hemorrhoids are puffy (swollen) veins around the rectum or anus. Hemorrhoids can cause pain, itching, bleeding, or irritation. HOME CARE  Eat foods with fiber, such as whole grains, beans, nuts, fruits, and vegetables. Ask your doctor about taking products with added fiber in them (fibersupplements).  Drink enough fluid to keep your pee (urine) clear or pale yellow.  Exercise often.  Go to the bathroom when you have the urge to poop. Do not wait.  Avoid straining to poop (bowel movement).  Keep the butt area dry and clean. Use wet toilet paper or moist paper towels.  Medicated creams and medicine inserted into the anus (anal suppository) may be  used or applied as told.  Only take medicine as told by your doctor.  Take a warm water bath (sitz bath) for 15-20 minutes to ease pain. Do this 3-4 times a day.  Place ice packs on the area if it is tender or puffy. Use the ice packs between the warm water baths.  Put ice in a plastic bag.  Place a towel between your skin and the bag.  Leave the ice on for 15-20 minutes, 03-04 times a day.  Do not  use a donut-shaped pillow or sit on the toilet for a long time. GET HELP RIGHT AWAY IF:   You have more pain that is not controlled by treatment or medicine.  You have bleeding that will not stop.  You have trouble or are unable to poop (bowel movement).  You have pain or puffiness outside the area of the hemorrhoids. MAKE SURE YOU:   Understand these instructions.  Will watch your condition.  Will get help right away if you are not doing well or get worse.   This information is not intended to replace advice given to you by your health care provider. Make sure you discuss any questions you have with your health care provider.   Document Released: 01/25/2008 Document Revised: 04/03/2012 Document Reviewed: 02/27/2012 Elsevier Interactive Patient Education [employer]

## 2015-07-14 DIAGNOSIS — W19XXXD Unspecified fall, subsequent encounter: Secondary | ICD-10-CM

## 2015-07-14 NOTE — Congregational Nurse Program (Signed)
Client saw mental health CN in order to get assistance navigating the healthcare system to get help paying for his medications.  He has not taken any of his prescribed medications in one month.  Client asked if CN would call [facility] to ask if medications are ready for me to pick up and if they had a waiver plan due to inability to pay for medications.  CN called [facility] and spoke with pharmacist.  Client has Medicaid and all medications except Tylenol, Anusol, and Percocet had no hard prescription with it.  Others are covered by Medicaid according to pharmacist.  [facility] and start them.  Client denies hallucinations at present.  Speech clear and client is oriented.

## 2016-11-07 ENCOUNTER — Encounter (HOSPITAL_COMMUNITY): Payer: Self-pay | Admitting: Vascular Surgery

## 2016-11-07 ENCOUNTER — Emergency Department (HOSPITAL_COMMUNITY): Payer: Self-pay

## 2016-11-07 ENCOUNTER — Emergency Department (HOSPITAL_COMMUNITY)
Admission: EM | Admit: 2016-11-07 | Discharge: 2016-11-07 | Disposition: A | Discharge status: Home / Self Care | Payer: Self-pay | Attending: Emergency Medicine | Admitting: Emergency Medicine

## 2016-11-07 DIAGNOSIS — M533 Sacrococcygeal disorders, not elsewhere classified: Secondary | ICD-10-CM | POA: Insufficient documentation

## 2016-11-07 DIAGNOSIS — I10 Essential (primary) hypertension: Secondary | ICD-10-CM | POA: Insufficient documentation

## 2016-11-07 DIAGNOSIS — F17210 Nicotine dependence, cigarettes, uncomplicated: Secondary | ICD-10-CM | POA: Insufficient documentation

## 2016-11-07 DIAGNOSIS — Z79899 Other long term (current) drug therapy: Secondary | ICD-10-CM | POA: Insufficient documentation

## 2016-11-07 DIAGNOSIS — M545 Low back pain, unspecified: Secondary | ICD-10-CM

## 2016-11-07 DIAGNOSIS — J45909 Unspecified asthma, uncomplicated: Secondary | ICD-10-CM | POA: Insufficient documentation

## 2016-11-07 MED ORDER — IBUPROFEN 400 MG PO TABS: 600.0000 mg | ORAL_TABLET | Freq: Once | ORAL | Status: DC

## 2016-11-07 NOTE — Discharge Instructions (Signed)
Please read attached information. If you experience any new or worsening signs or symptoms please return to the emergency room for evaluation. Please follow-up with your primary care provider or specialist as discussed.  Please use Tylenol as needed for discomfort. °

## 2016-11-07 NOTE — ED Notes (Signed)
Called x2 for triage with no answer. 

## 2016-11-07 NOTE — ED Notes (Signed)
Pt called x 3 for triage no answer 

## 2016-11-07 NOTE — ED Notes (Signed)
Pts come to the nurse first desk from the front doors stating he has been here the whole time, pt instructed that we have called his name four times. He states he was sitting in the lobby the whole time. This rn saw he come into the front door at 1405. The patient is now waiting for triage again.

## 2016-11-07 NOTE — ED Notes (Signed)
Pt is in stable condition upon d/c and ambulates from ED. 

## 2016-11-07 NOTE — ED Triage Notes (Signed)
Pt reports to the ED for eval of low back pain and bilateral hip pain after falling off a ladder last week. He states that he has had pain ever since. He is ambulatory without difficulty and denies any head injury or LOC. Denies any numbness, tingling, paralysis, or bowel or bladder changes. Has not tried any OTC meds at home.

## 2016-11-07 NOTE — ED Notes (Signed)
Patient transported to X-ray 

## 2016-11-07 NOTE — ED Notes (Signed)
Pt called for triage, no answer

## 2016-11-07 NOTE — ED Provider Notes (Signed)
[facility]-EMERGENCY DEPT Provider Note   CSN: [id number] Arrival date & time: 11/07/16  1226   By signing my name below, I, Xavier [name], attest that this documentation has been prepared under the direction and in the presence of Xavier [name] Heyden, PA-C Electronically Signed: Soijett [name], ED Scribe. 11/07/16. 3:48 PM.  History   Chief Complaint Chief Complaint  Patient presents with  . Back Pain    HPI Xavier [name] is a 56 y.o. male with a PMHx of back pain, HTN, who presents to the Emergency Department complaining of 9-10/10 lower back pain onset 3 days ago. Pt has tried tylenol with no relief of his symptoms. He states that he has been consuming ETOH in order to control his lower back pain with no relief of his symptoms. He notes that he was on the middle of a  ladder carrying "material" when he fell and struck his lower back on more "material." Pt reports that he has continued working since the incident. Pt denies hitting his head, LOC, numbness, tingling, bowel/bladder incontinence, color change, wound, and any other symptoms.     The history is provided by the patient. No language interpreter was used.    Past Medical History:  Diagnosis Date  . Asthma   . Back pain   . Depression   . Hypertension     Patient Active Problem List   Diagnosis Date Noted  . Narcotic overdose 12/10/2012  . HTN (hypertension) 12/10/2012  . Alcoholism (HCC) 12/10/2012  . Acute respiratory failure (HCC) 12/10/2012    Past Surgical History:  Procedure Laterality Date  . HAND TENDON SURGERY         Home Medications    Prior to Admission medications   Medication Sig Start Date End Date Taking? Authorizing Provider  acetaminophen (TYLENOL) 500 MG tablet Take 1 tablet (500 mg total) by mouth every 6 (six) hours as needed. Patient not taking: Reported on 06/25/2015 08/12/13   Emilia BeckSzekalski, Kaitlyn, PA-C  albuterol (PROVENTIL HFA;VENTOLIN HFA) 108 (90 BASE) MCG/ACT inhaler Inhale 2 puffs into the lungs  every 6 (six) hours as needed for wheezing. Patient not taking: Reported on 06/25/2015 04/05/15   Arthor CaptainHarris, Abigail, PA-C  cyclobenzaprine (FLEXERIL) 10 MG tablet Take 0.5-1 tablets (5-10 mg total) by mouth 3 (three) times daily as needed for muscle spasms. Patient not taking: Reported on 07/14/2015 06/25/15   Raliegh IpGottschalk, Ashly M, DO  hydrocortisone (ANUSOL-HC) 2.5 % rectal cream Apply to rectum 3 times daily for hemorrhoid Patient not taking: Reported on 07/14/2015 06/25/15   Raliegh IpGottschalk, Ashly M, DO  oxyCODONE-acetaminophen (PERCOCET/ROXICET) 5-325 MG tablet Take 1 tablet by mouth every 4 (four) hours as needed for severe pain. Patient not taking: Reported on 06/25/2015 04/05/15   Arthor CaptainHarris, Abigail, PA-C  QUEtiapine (SEROQUEL) 50 MG tablet Take 3 tablets (150 mg total) by mouth 2 (two) times daily. Patient not taking: Reported on 06/25/2015 04/05/15   Arthor CaptainHarris, Abigail, PA-C    Family History No family history on file.  Social History Social History  Substance Use Topics  . Smoking status: Current Every Day Smoker    Packs/day: 1.00    Types: Cigarettes  . Smokeless tobacco: Never Used  . Alcohol use Yes     Comment: occasionally      Allergies   Nsaids   Review of Systems Review of Systems  Gastrointestinal:       No bowel incontinence.   Genitourinary:       No bladder incontinence.   Musculoskeletal: Positive for  back pain (lower).  Skin: Negative for color change and wound.  Neurological: Negative for syncope and numbness.       No tingling     Physical Exam Updated Vital Signs BP 113/76 (BP Location: Right Arm)   Pulse 73   Temp 98.1 F (36.7 C) (Oral)   Resp 16   Ht 5\' 7"  (1.702 m)   Wt 130 lb (59 kg)   SpO2 100%   BMI 20.36 kg/m   Physical Exam  Constitutional: He is oriented to person, place, and time. He appears well-developed and well-nourished. No distress.  HENT:  Head: Normocephalic and atraumatic.  Eyes: EOM are normal.  Neck: Normal range of motion. Neck  supple.  Cardiovascular: Normal rate.   Pulmonary/Chest: Effort normal. No respiratory distress.  Abdominal: He exhibits no distension.  Musculoskeletal: Normal range of motion. He exhibits tenderness. He exhibits no edema.  TTP diffusely to the posterior hips. Generalized tenderness to back diffusely. No focal tenderness. No C, T, or L spine tenderness to palpation. No obvious signs of trauma, deformity, infection, step-offs. Lung expansion normal. No scoliosis or kyphosis. Bilateral lower extremity strength 5 out of 5, sensation grossly intact  Straight leg negative  Ambulates negative   Neurological: He is alert and oriented to person, place, and time.  Skin: Skin is warm and dry. He is not diaphoretic.  Psychiatric: He has a normal mood and affect. His behavior is normal. Judgment and thought content normal.  Nursing note and vitals reviewed.    ED Treatments / Results  DIAGNOSTIC STUDIES: Oxygen Saturation is 100% on RA, nl by my interpretation.    COORDINATION OF CARE: 3:33 PM Discussed treatment plan with pt at bedside and pt agreed to plan.   Labs (all labs ordered are listed, but only abnormal results are displayed) Labs Reviewed - No data to display  EKG  EKG Interpretation None       Radiology No results found.  Procedures Procedures (including critical care time)  Medications Ordered in ED Medications - No data to display   Initial Impression / Assessment and Plan / ED Course  I have reviewed the triage vital signs and the nursing notes.  Pertinent labs & imaging results that were available during my care of the patient were reviewed by me and considered in my medical decision making (see chart for details).    Labs:   Imaging:  Consults:  Therapeutics:  Discharge Meds:   Assessment/Plan: Pt presents with lower back pain s/p fall from.. No neurological deficits and normal neuro exam.  Patient is ambulatory.  No loss of bowel or bladder  control.  No concern for cauda equina.  No fever, night sweats, weight loss, h/o cancer, IVDA, no recent procedure to back. No urinary symptoms suggestive of UTI.  Xray negative for acute abnormalities. Pt will be discharged home Supportive care and return precaution discussed. Appears safe for discharge at this time. Follow up as indicated in discharge paperwork.   Final Clinical Impressions(s) / ED Diagnoses   Final diagnoses:  Sacral pain  Acute bilateral low back pain without sciatica    New Prescriptions Discharge Medication List as of 11/07/2016  5:17 PM     I personally performed the services described in this documentation, which was scribed in my presence. The recorded information has been reviewed and is accurate.     Eyvonne Mechanic, PA-C 11/09/16 Jeanella Flattery, MD 11/10/16 [phone]

## 2018-09-26 ENCOUNTER — Emergency Department (HOSPITAL_COMMUNITY)
Admission: EM | Admit: 2018-09-26 | Discharge: 2018-09-26 | Disposition: A | Discharge status: Home / Self Care | Payer: No Typology Code available for payment source | Attending: Emergency Medicine | Admitting: Emergency Medicine

## 2018-09-26 ENCOUNTER — Emergency Department (HOSPITAL_COMMUNITY): Payer: No Typology Code available for payment source

## 2018-09-26 ENCOUNTER — Other Ambulatory Visit: Payer: Self-pay

## 2018-09-26 DIAGNOSIS — F17210 Nicotine dependence, cigarettes, uncomplicated: Secondary | ICD-10-CM | POA: Insufficient documentation

## 2018-09-26 DIAGNOSIS — W2210XA Striking against or struck by unspecified automobile airbag, initial encounter: Secondary | ICD-10-CM | POA: Insufficient documentation

## 2018-09-26 DIAGNOSIS — Y998 Other external cause status: Secondary | ICD-10-CM | POA: Insufficient documentation

## 2018-09-26 DIAGNOSIS — J45909 Unspecified asthma, uncomplicated: Secondary | ICD-10-CM | POA: Insufficient documentation

## 2018-09-26 DIAGNOSIS — M25511 Pain in right shoulder: Secondary | ICD-10-CM | POA: Diagnosis not present

## 2018-09-26 DIAGNOSIS — I10 Essential (primary) hypertension: Secondary | ICD-10-CM | POA: Diagnosis not present

## 2018-09-26 DIAGNOSIS — Y92410 Unspecified street and highway as the place of occurrence of the external cause: Secondary | ICD-10-CM | POA: Insufficient documentation

## 2018-09-26 DIAGNOSIS — Z79899 Other long term (current) drug therapy: Secondary | ICD-10-CM | POA: Diagnosis not present

## 2018-09-26 DIAGNOSIS — Y9389 Activity, other specified: Secondary | ICD-10-CM | POA: Insufficient documentation

## 2018-09-26 DIAGNOSIS — M25552 Pain in left hip: Secondary | ICD-10-CM | POA: Insufficient documentation

## 2018-09-26 DIAGNOSIS — M542 Cervicalgia: Secondary | ICD-10-CM | POA: Insufficient documentation

## 2018-09-26 MED ORDER — IBUPROFEN 400 MG PO TABS: 400.0000 mg | ORAL_TABLET | Freq: Four times a day (QID) | ORAL | 0 refills | Status: AC | PRN

## 2018-09-26 MED ORDER — ACETAMINOPHEN 325 MG PO TABS: 650.0000 mg | ORAL_TABLET | Freq: Four times a day (QID) | ORAL | 0 refills | Status: DC | PRN
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
## 2018-09-26 NOTE — ED Notes (Signed)
Patient verbalizes understanding of discharge instructions. Opportunity for questioning and answers were provided. Armband removed by staff, pt discharged from ED.  

## 2018-09-30 ENCOUNTER — Ambulatory Visit (HOSPITAL_COMMUNITY)
Admission: EM | Admit: 2018-09-30 | Discharge: 2018-09-30 | Disposition: A | Discharge status: Home / Self Care | Payer: Medicaid Other | Attending: Family Medicine | Admitting: Family Medicine

## 2018-09-30 ENCOUNTER — Other Ambulatory Visit: Payer: Self-pay

## 2018-09-30 ENCOUNTER — Encounter (HOSPITAL_COMMUNITY): Payer: Self-pay

## 2018-09-30 DIAGNOSIS — S46919D Strain of unspecified muscle, fascia and tendon at shoulder and upper arm level, unspecified arm, subsequent encounter: Secondary | ICD-10-CM

## 2018-09-30 DIAGNOSIS — S46911D Strain of unspecified muscle, fascia and tendon at shoulder and upper arm level, right arm, subsequent encounter: Secondary | ICD-10-CM | POA: Diagnosis not present

## 2018-09-30 DIAGNOSIS — S39012D Strain of muscle, fascia and tendon of lower back, subsequent encounter: Secondary | ICD-10-CM | POA: Diagnosis not present

## 2018-09-30 MED ORDER — CYCLOBENZAPRINE HCL 5 MG PO TABS: 5.0000 mg | ORAL_TABLET | Freq: Two times a day (BID) | ORAL | 0 refills | Status: AC | PRN

## 2018-09-30 MED ORDER — ACETAMINOPHEN 325 MG PO TABS: 650.0000 mg | ORAL_TABLET | Freq: Four times a day (QID) | ORAL | 0 refills | Status: DC | PRN

## 2018-09-30 MED ORDER — ALBUTEROL SULFATE HFA 108 (90 BASE) MCG/ACT IN AERS: 2.0000 | INHALATION_SPRAY | Freq: Four times a day (QID) | RESPIRATORY_TRACT | 0 refills | Status: DC | PRN

## 2018-09-30 MED ORDER — ACETAMINOPHEN 325 MG PO TABS: 650.0000 mg | ORAL_TABLET | Freq: Four times a day (QID) | ORAL | 0 refills | Status: AC | PRN
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
## 2018-09-30 NOTE — Discharge Instructions (Addendum)
May take Tylenol as needed for additional pain relief. Use hot compresses for additional pain relief. May take muscle relaxer if needed, recommend taking before bedtime as you should not be driving while taking this medication.   Please follow-up with your PCP for routine care as they can do routine medication refills as well.
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
## 2019-06-16 ENCOUNTER — Emergency Department (HOSPITAL_COMMUNITY)
Admission: EM | Admit: 2019-06-16 | Discharge: 2019-06-16 | Disposition: A | Discharge status: Home / Self Care | Payer: Medicaid Other | Attending: Emergency Medicine | Admitting: Emergency Medicine

## 2019-06-16 ENCOUNTER — Emergency Department (HOSPITAL_COMMUNITY): Payer: Medicaid Other

## 2019-06-16 ENCOUNTER — Encounter (HOSPITAL_COMMUNITY): Payer: Self-pay | Admitting: Emergency Medicine

## 2019-06-16 ENCOUNTER — Other Ambulatory Visit: Payer: Self-pay

## 2019-06-16 DIAGNOSIS — Y939 Activity, unspecified: Secondary | ICD-10-CM | POA: Insufficient documentation

## 2019-06-16 DIAGNOSIS — Y999 Unspecified external cause status: Secondary | ICD-10-CM | POA: Insufficient documentation

## 2019-06-16 DIAGNOSIS — I10 Essential (primary) hypertension: Secondary | ICD-10-CM | POA: Insufficient documentation

## 2019-06-16 DIAGNOSIS — W28XXXA Contact with powered lawn mower, initial encounter: Secondary | ICD-10-CM | POA: Insufficient documentation

## 2019-06-16 DIAGNOSIS — Z23 Encounter for immunization: Secondary | ICD-10-CM | POA: Insufficient documentation

## 2019-06-16 DIAGNOSIS — R030 Elevated blood-pressure reading, without diagnosis of hypertension: Secondary | ICD-10-CM | POA: Diagnosis not present

## 2019-06-16 DIAGNOSIS — S6992XA Unspecified injury of left wrist, hand and finger(s), initial encounter: Secondary | ICD-10-CM | POA: Insufficient documentation

## 2019-06-16 DIAGNOSIS — J45909 Unspecified asthma, uncomplicated: Secondary | ICD-10-CM | POA: Diagnosis not present

## 2019-06-16 DIAGNOSIS — Y929 Unspecified place or not applicable: Secondary | ICD-10-CM | POA: Diagnosis not present

## 2019-06-16 DIAGNOSIS — F17210 Nicotine dependence, cigarettes, uncomplicated: Secondary | ICD-10-CM | POA: Diagnosis not present

## 2019-06-16 MED ORDER — TETANUS-DIPHTH-ACELL PERTUSSIS 5-2.5-18.5 LF-MCG/0.5 IM SUSP
0.5000 mL | Freq: Once | INTRAMUSCULAR | Status: AC
  Administered 2019-06-16: 0.5 mL via INTRAMUSCULAR
  Filled 2019-06-16: qty 0.5

## 2019-06-16 MED ORDER — ACETAMINOPHEN 325 MG PO TABS
325.0000 mg | ORAL_TABLET | Freq: Once | ORAL | Status: AC
  Administered 2019-06-16: 325 mg via ORAL
  Filled 2019-06-16: qty 1

## 2019-06-16 NOTE — ED Provider Notes (Addendum)
[facility] EMERGENCY DEPARTMENT Provider Note   CSN: [id number] Arrival date & time: 06/16/19  1338     History Chief Complaint  Patient presents with  . Finger Injury    Xavier [name] is a 59 y.o. male history of asthma, depression, hypertension, alcohol abuse.  Patient presents today for injury of the tip of his index finger.  He reports that 1 month ago he was reaching into a lawnmower to pull grass clippings out when the blade struck the tip of his finger.  He had immediate onset sharp pain constant nonradiating worsened with palpation of the area improved with rest.  He reports that to avoid infection since that time he has been placing alcohol and peroxide on the tip of his finger multiple times a day.  He reports that he has continued to have pain but now describes as a constant throbbing sensation.  He denies any new injury or other concerns today.  He has attempted Tylenol and ibuprofen with minimal relief.  Denies history of fever/chills, numbness/tingling, weakness, drainage, swelling or any additional concerns.  HPI     Past Medical History:  Diagnosis Date  . Asthma   . Back pain   . Depression   . Hypertension     Patient Active Problem List   Diagnosis Date Noted  . Narcotic overdose (HCC) 12/10/2012  . HTN (hypertension) 12/10/2012  . Alcoholism (HCC) 12/10/2012  . Acute respiratory failure (HCC) 12/10/2012    Past Surgical History:  Procedure Laterality Date  . HAND TENDON SURGERY         No family history on file.  Social History   Tobacco Use  . Smoking status: Current Every Day Smoker    Packs/day: 1.00    Types: Cigarettes  . Smokeless tobacco: Never Used  Substance Use Topics  . Alcohol use: Yes    Comment: occasionally   . Drug use: No    Home Medications Prior to Admission medications   Medication Sig Start Date End Date Taking? Authorizing Provider  albuterol (VENTOLIN HFA) 108 (90 Base) MCG/ACT inhaler  Inhale 2 puffs into the lungs every 6 (six) hours as needed for wheezing. 09/30/18   Hall-Potvin, [place], PA-C  oxyCODONE-acetaminophen (PERCOCET/ROXICET) 5-325 MG tablet Take 1 tablet by mouth every 4 (four) hours as needed for severe pain. Patient not taking: Reported on 06/25/2015 04/05/15   Arthor Captain, PA-C  QUEtiapine (SEROQUEL) 50 MG tablet Take 3 tablets (150 mg total) by mouth 2 (two) times daily. 04/05/15   Arthor Captain, PA-C    Allergies    Nsaids  Review of Systems   Review of Systems  Constitutional: Negative.  Negative for chills and fever.  Gastrointestinal: Negative.  Negative for nausea and vomiting.  Skin: Positive for wound. Negative for color change.  Neurological: Negative.  Negative for weakness and numbness.    Physical Exam Updated Vital Signs BP (!) 147/100 (BP Location: Right Arm)   Pulse 81   Temp 98.4 F (36.9 C) (Oral)   Resp 18   SpO2 100%   Physical Exam Constitutional:      General: He is not in acute distress.    Appearance: Normal appearance. He is well-developed. He is not ill-appearing or diaphoretic.  HENT:     Head: Normocephalic and atraumatic.     Right Ear: External ear normal.     Left Ear: External ear normal.     Nose: Nose normal.  Eyes:     General: Vision  grossly intact. Gaze aligned appropriately.     Pupils: Pupils are equal, round, and reactive to light.  Neck:     Trachea: Trachea and phonation normal. No tracheal deviation.  Cardiovascular:     Rate and Rhythm: Normal rate and regular rhythm.     Pulses:          Radial pulses are 2+ on the right side and 2+ on the left side.  Pulmonary:     Effort: Pulmonary effort is normal. No respiratory distress.  Abdominal:     General: There is no distension.     Palpations: Abdomen is soft.     Tenderness: There is no abdominal tenderness. There is no guarding or rebound.  Musculoskeletal:        General: Normal range of motion.     Cervical back: Normal range of  motion.     Comments: Left hand: Wound of the tip of the index finger, no active bleeding or drainage. Otherwise skin intact. Fingers appear normal. Tenderness to palpation directly over wound. No tenderness of the pad. No snuffbox tenderness to palpation. No tenderness to palpation over flexor sheath.  Finger adduction/abduction intact with 5/5 strength.  Thumb opposition intact. Full active and resisted ROM to flexion/extension at wrist, MCP, PIP and DIP of all fingers.  FDS/FDP intact. Grip 5/5 strength. Radial artery 2+ with <2sec cap refill in all fingers. Sensation intact to light-tough in median/ulnar/radial distributions.  Skin:    General: Skin is warm and dry.  Neurological:     Mental Status: He is alert.     GCS: GCS eye subscore is 4. GCS verbal subscore is 5. GCS motor subscore is 6.     Comments: Speech is clear and goal oriented, follows commands Major Cranial nerves without deficit, no facial droop Moves extremities without ataxia, coordination intact  Psychiatric:        Behavior: Behavior normal.         ED Results / Procedures / Treatments   Labs (all labs ordered are listed, but only abnormal results are displayed) Labs Reviewed - No data to display  EKG None  Radiology DG Finger Index Left  Result Date: 06/16/2019 CLINICAL DATA:  Laceration second digit 1 month ago, pain EXAM: LEFT INDEX FINGER 2+V COMPARISON:  None. FINDINGS: Frontal, oblique, lateral views of the left second digit are obtained. There is mild soft tissue swelling distal aspect second digit. No acute or destructive bony lesions. No radiopaque foreign bodies. Joint spaces are well preserved. IMPRESSION: 1. Mild soft tissue swelling distal aspect second digit. Electronically Signed   By: Sharlet Salina M.D.   On: 06/16/2019 15:39    Procedures Procedures (including critical care time)  Medications Ordered in ED Medications  Tdap (BOOSTRIX) injection 0.5 mL (0.5 mLs Intramuscular Given  06/16/19 1515)    ED Course  I have reviewed the triage vital signs and the nursing notes.  Pertinent labs & imaging results that were available during my care of the patient were reviewed by me and considered in my medical decision making (see chart for details).  Clinical Course as of Jun 15 1628  Mon Jun 16, 2019  1436 Patient not in room   [BM]  1453 Patient not in room   [BM]    Clinical Course User Index [BM] Elizabeth Palau   MDM Rules/Calculators/A&P                     59 year old male presents  today for injury of the distal tip of the left index finger that occurred approximately 1 month ago.  He has been applying hydrogen peroxide and rubbing alcohol to the tip of his finger multiple times a day for the past 1 month.  He has no neurologic complaint.  He is neurovascular intact to the finger with good strength with all movements, no flexor tendon injury or extensor injury.  Capillary refill is intact and nailbed is intact.  There is no evidence of cellulitis, DVT, septic arthritis, compartment syndrome or other emergent pathologies.  Due to patient's duration of pain will obtain a x-ray of the area to evaluate for possible occult fracture.  DG left index finger:   IMPRESSION:  1. Mild soft tissue swelling distal aspect second digit.  - I personally reviewed the x-ray above.  Feel that mild soft tissue swelling is not related to infection today he has no evidence of a felon, septic joint, cellulitis, paronychia or other infectious pathologies at this time.  I think likely the patient's repeated use of hydrogen peroxide and rubbing alcohol has delayed healing and contributed to his pain.  I will advise patient stop using these and apply only a small amount of topical antibacterial ointment and clean bandage daily.  He will be given hand referral for follow-up and encouraged to return to emergency department for any new or worsening symptoms.  There is no indication for  antibiotics at this point.  Additionally patient's blood pressure noted to be elevated today.  Patient informed to follow-up with PCP for blood pressure recheck within 1 week and to discuss medication management if indicated at that time.  Currently asymptomatic regarding elevated blood pressure reading.  Patient informed of signs/symptoms of hypertensive urgency/emergency and to return to the ER if they occur.  At this time there does not appear to be any evidence of an acute emergency medical condition and the patient appears stable for discharge with appropriate outpatient follow up. Diagnosis was discussed with patient who verbalizes understanding of care plan and is agreeable to discharge. I have discussed return precautions with patient who verbalizes understanding of return precautions. Patient encouraged to follow-up with their PCP and hand. All questions answered.  Note: Portions of this report may have been transcribed using voice recognition software. Every effort was made to ensure accuracy; however, inadvertent computerized transcription errors may still be present. Final Clinical Impression(s) / ED Diagnoses Final diagnoses:  Injury of finger of left hand, initial encounter  Elevated blood pressure reading    Rx / DC Orders ED Discharge Orders    None       Gari Crown 06/16/19 1630    Gari Crown 06/16/19 1647    Margette Fast, MD 06/18/19 2115

## 2019-06-16 NOTE — ED Notes (Signed)
Finger splint applied to L index finger.

## 2019-06-16 NOTE — Discharge Instructions (Signed)
You have been diagnosed today with left index finger injury, elevated blood pressure reading.  At this time there does not appear to be the presence of an emergent medical condition, however there is always the potential for conditions to change. Please read and follow the below instructions.  Please return to the Emergency Department immediately for any new or worsening symptoms. Please be sure to follow up with your Primary Care Provider within one week regarding your visit today; please call their office to schedule an appointment even if you are feeling better for a follow-up visit. Please call the hand specialist Dr. Arita Miss on your discharge paperwork for follow-up appointment for further evaluation and treatment of your finger injury.  Please stop using the hydrogen peroxide and rubbing alcohol on your finger as this is likely delaying the healing of your finger.  You may use a small amount of antibiotic ointment and/or lotion on the area and a sterile bandage to protect the area.  Please drink plenty of water and get plenty of rest.  Get help right away if: You have very bad swelling around the wound. Your pain suddenly gets worse and is very bad. You notice painful lumps near the wound or anywhere on your body. Get a very bad headache. Start to feel mixed up (confused). Feel weak or numb. Feel faint. Have very bad pain in your: Chest. Belly (abdomen). Throw up more than once. Have trouble breathing. You have a red streak going away from your wound. The wound is on your hand or foot, and: You cannot move a finger or toe. Your fingers or toes look pale or bluish. 6. You have any new/concerning or worsening of symptoms  Please read the additional information packets attached to your discharge summary.  Do not take your medicine if  develop an itchy rash, swelling in your mouth or lips, or difficulty breathing; call 911 and seek immediate emergency medical attention if this  occurs.  Note: Portions of this text may have been transcribed using voice recognition software. Every effort was made to ensure accuracy; however, inadvertent computerized transcription errors may still be present.

## 2019-06-16 NOTE — ED Triage Notes (Signed)
Pt reports 1 report ago he cut the tip of his right index finger on a lawnmower blade. Pt states this finger is now throbbing.
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
## 2020-12-07 ENCOUNTER — Inpatient Hospital Stay (HOSPITAL_COMMUNITY)
Admission: EM | Admit: 2020-12-07 | Discharge: 2020-12-09 | DRG: 683 | Disposition: A | Discharge status: Home / Self Care | Payer: Medicaid - Out of State | Attending: Internal Medicine | Admitting: Internal Medicine

## 2020-12-07 ENCOUNTER — Encounter (HOSPITAL_COMMUNITY): Payer: Self-pay | Admitting: Pharmacy Technician

## 2020-12-07 DIAGNOSIS — T63441A Toxic effect of venom of bees, accidental (unintentional), initial encounter: Secondary | ICD-10-CM | POA: Diagnosis present

## 2020-12-07 DIAGNOSIS — F1020 Alcohol dependence, uncomplicated: Secondary | ICD-10-CM | POA: Diagnosis present

## 2020-12-07 DIAGNOSIS — E860 Dehydration: Secondary | ICD-10-CM | POA: Diagnosis present

## 2020-12-07 DIAGNOSIS — Z20822 Contact with and (suspected) exposure to covid-19: Secondary | ICD-10-CM | POA: Diagnosis present

## 2020-12-07 DIAGNOSIS — N179 Acute kidney failure, unspecified: Secondary | ICD-10-CM | POA: Diagnosis not present

## 2020-12-07 DIAGNOSIS — I10 Essential (primary) hypertension: Secondary | ICD-10-CM | POA: Diagnosis present

## 2020-12-07 DIAGNOSIS — M6282 Rhabdomyolysis: Secondary | ICD-10-CM | POA: Diagnosis present

## 2020-12-07 DIAGNOSIS — J45909 Unspecified asthma, uncomplicated: Secondary | ICD-10-CM | POA: Diagnosis present

## 2020-12-07 DIAGNOSIS — X30XXXA Exposure to excessive natural heat, initial encounter: Secondary | ICD-10-CM

## 2020-12-07 DIAGNOSIS — Z886 Allergy status to analgesic agent status: Secondary | ICD-10-CM

## 2020-12-07 DIAGNOSIS — R7303 Prediabetes: Secondary | ICD-10-CM | POA: Diagnosis present

## 2020-12-07 DIAGNOSIS — F1410 Cocaine abuse, uncomplicated: Secondary | ICD-10-CM | POA: Diagnosis present

## 2020-12-07 DIAGNOSIS — F32A Depression, unspecified: Secondary | ICD-10-CM | POA: Diagnosis present

## 2020-12-07 DIAGNOSIS — R739 Hyperglycemia, unspecified: Secondary | ICD-10-CM | POA: Diagnosis not present

## 2020-12-07 DIAGNOSIS — T675XXA Heat exhaustion, unspecified, initial encounter: Secondary | ICD-10-CM | POA: Diagnosis present

## 2020-12-07 DIAGNOSIS — F17210 Nicotine dependence, cigarettes, uncomplicated: Secondary | ICD-10-CM | POA: Diagnosis present

## 2020-12-07 DIAGNOSIS — E872 Acidosis: Secondary | ICD-10-CM | POA: Diagnosis not present

## 2020-12-07 LAB — COMPREHENSIVE METABOLIC PANEL
ALT: 110 U/L — ABNORMAL HIGH (ref 0–44)
AST: 129 U/L — ABNORMAL HIGH (ref 15–41)
Albumin: 4.2 g/dL (ref 3.5–5.0)
Alkaline Phosphatase: 47 U/L (ref 38–126)
Anion gap: 18 — ABNORMAL HIGH (ref 5–15)
BUN: 13 mg/dL (ref 6–20)
CO2: 18 mmol/L — ABNORMAL LOW (ref 22–32)
Calcium: 9.9 mg/dL (ref 8.9–10.3)
Chloride: 105 mmol/L (ref 98–111)
Creatinine, Ser: 1.77 mg/dL — ABNORMAL HIGH (ref 0.61–1.24)
GFR, Estimated: 44 mL/min — ABNORMAL LOW (ref >60)
Glucose, Bld: 102 mg/dL — ABNORMAL HIGH (ref 70–99)
Potassium: 3.7 mmol/L (ref 3.5–5.1)
Sodium: 141 mmol/L (ref 135–145)
Total Bilirubin: 0.6 mg/dL (ref 0.3–1.2)
Total Protein: 7.4 g/dL (ref 6.5–8.1)

## 2020-12-07 LAB — CBC WITH DIFFERENTIAL/PLATELET
Abs Immature Granulocytes: 0.05 10*3/uL (ref 0.00–0.07)
Basophils Absolute: 0.1 10*3/uL (ref 0.0–0.1)
Basophils Relative: 1 %
Eosinophils Absolute: 0.2 10*3/uL (ref 0.0–0.5)
Eosinophils Relative: 2 %
HCT: 50 % (ref 39.0–52.0)
Hemoglobin: 16 g/dL (ref 13.0–17.0)
Immature Granulocytes: 1 %
Lymphocytes Relative: 27 %
Lymphs Abs: 2.4 10*3/uL (ref 0.7–4.0)
MCH: 31.7 pg (ref 26.0–34.0)
MCHC: 32 g/dL (ref 30.0–36.0)
MCV: 99.2 fL (ref 80.0–100.0)
Monocytes Absolute: 0.6 10*3/uL (ref 0.1–1.0)
Monocytes Relative: 7 %
Neutro Abs: 5.5 10*3/uL (ref 1.7–7.7)
Neutrophils Relative %: 62 %
Platelets: 178 10*3/uL (ref 150–400)
RBC: 5.04 MIL/uL (ref 4.22–5.81)
RDW: 13.6 % (ref 11.5–15.5)
WBC: 8.8 10*3/uL (ref 4.0–10.5)
nRBC: 0 % (ref 0.0–0.2)

## 2020-12-07 LAB — CK: Total CK: 1061 U/L — ABNORMAL HIGH (ref 49–397)

## 2020-12-07 LAB — TROPONIN I (HIGH SENSITIVITY)
Troponin I (High Sensitivity): 10 ng/L (ref <18)
Troponin I (High Sensitivity): 10 ng/L (ref <18)

## 2020-12-07 LAB — MAGNESIUM: Magnesium: 2.4 mg/dL (ref 1.7–2.4)

## 2020-12-07 MED ORDER — DEXTROSE IN LACTATED RINGERS 5 % IV SOLN
INTRAVENOUS | Status: DC
  Filled 2020-12-07 (×3): qty 1000

## 2020-12-07 MED ORDER — SODIUM CHLORIDE 0.9 % IV BOLUS
1000.0000 mL | Freq: Once | INTRAVENOUS | Status: AC
  Administered 2020-12-07: 1000 mL via INTRAVENOUS

## 2020-12-07 MED ORDER — FOLIC ACID 1 MG PO TABS
1.0000 mg | ORAL_TABLET | Freq: Every day | ORAL | Status: DC
  Administered 2020-12-08 – 2020-12-09 (×2): 1 mg via ORAL
  Filled 2020-12-07 (×2): qty 1

## 2020-12-07 MED ORDER — THIAMINE HCL 100 MG/ML IJ SOLN
100.0000 mg | Freq: Every day | INTRAMUSCULAR | Status: DC
  Filled 2020-12-07: qty 2

## 2020-12-07 MED ORDER — ADULT MULTIVITAMIN W/MINERALS CH
1.0000 | ORAL_TABLET | Freq: Every day | ORAL | Status: DC
  Administered 2020-12-08 – 2020-12-09 (×2): 1 via ORAL
  Filled 2020-12-07 (×2): qty 1

## 2020-12-07 MED ORDER — LORAZEPAM 2 MG/ML IJ SOLN: 1.0000 mg | INTRAMUSCULAR | Status: DC | PRN

## 2020-12-07 MED ORDER — THIAMINE HCL 100 MG PO TABS
100.0000 mg | ORAL_TABLET | Freq: Every day | ORAL | Status: DC
  Administered 2020-12-08 – 2020-12-09 (×2): 100 mg via ORAL
  Filled 2020-12-07 (×2): qty 1

## 2020-12-07 MED ORDER — ONDANSETRON HCL 4 MG PO TABS: 4.0000 mg | ORAL_TABLET | Freq: Four times a day (QID) | ORAL | Status: DC | PRN

## 2020-12-07 MED ORDER — LORAZEPAM 1 MG PO TABS: 1.0000 mg | ORAL_TABLET | ORAL | Status: DC | PRN

## 2020-12-07 MED ORDER — ONDANSETRON HCL 4 MG/2ML IJ SOLN: 4.0000 mg | Freq: Four times a day (QID) | INTRAMUSCULAR | Status: DC | PRN

## 2020-12-07 MED ORDER — HEPARIN SODIUM (PORCINE) 5000 UNIT/ML IJ SOLN
5000.0000 [IU] | Freq: Three times a day (TID) | INTRAMUSCULAR | Status: DC
  Administered 2020-12-08 – 2020-12-09 (×4): 5000 [IU] via SUBCUTANEOUS
  Filled 2020-12-07 (×4): qty 1

## 2020-12-07 NOTE — ED Notes (Signed)
Pt is ambulatory to the bathroom with a steady gait. 

## 2020-12-07 NOTE — H&P (Signed)
History and Physical   Xavier [name] WNI:[id number] DOB: 10/09/1960 DOA: 12/07/2020  Referring MD/NP/PA: Dr. Rosalia Hammers  PCP: Pcp, No   Outpatient Specialists: None  Patient coming from: Home  Chief Complaint: Insect bite and leg pain  HPI: Xavier [name] is a 60 y.o. male with medical history significant of alcohol abuse, asthma, hypertension, depression, who presented to the ER complaining of being stung by a bee.  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  He has been working in the yard for the most part trying to drink some fluids but not adequately.  Patient started experiencing worsening leg cramping today and was stung at least 2 times by a bee.  He remembers being stung in his nose and back of his head.  EMS were called.  When they arrived he was diaphoretic as well as weak.  Appears very dehydrated.  No evidence of anaphylactic reaction and did not get treated for that.  He was brought in to the ER where he was noted to have AKI as well as elevated CPK.  Additionally patient endorsed taking cocaine yesterday as well as drinking a large number of beers.  Patient being admitted at this point with mild rhabdomyolysis and prerenal azotemia.  This is complicated with alcohol abuse and cocaine abuse..  ED Course: Temperature 99.1 blood pressure 93/81 initially pulse 90 respirate 22 oxygen sat 98% on room air.  Sodium 141 potassium 3.7 chloride 105 CO2 18 glucose 102 BUN 13 creatinine 1.77 calcium 8.9.  Magnesium is 2.4 with a gap of 18.  AST 129 ALT 110.  Initial CPK 1061.  CBC within normal and troponin 10.  Patient being admitted with AKI, polysubstance abuse and mild rhabdomyolysis  Review of Systems: As per HPI otherwise 10 point review of systems negative.    Past Medical History:  Diagnosis Date   Asthma    Back pain    Depression    Hypertension     Past Surgical History:  Procedure Laterality Date   HAND TENDON SURGERY       reports that he has been smoking  cigarettes. He has been smoking an average of 1 pack per day. He has never used smokeless tobacco. He reports current alcohol use. He reports that he does not use drugs.  Allergies  Allergen Reactions   Nsaids Rash    No family history on file.   Prior to Admission medications   Medication Sig Start Date End Date Taking? Authorizing Provider  albuterol (VENTOLIN HFA) 108 (90 Base) MCG/ACT inhaler Inhale 2 puffs into the lungs every 6 (six) hours as needed for wheezing. 09/30/18   Hall-Potvin, [place], PA-C  oxyCODONE-acetaminophen (PERCOCET/ROXICET) 5-325 MG tablet Take 1 tablet by mouth every 4 (four) hours as needed for severe pain. Patient not taking: Reported on 06/25/2015 04/05/15   Arthor Captain, PA-C  QUEtiapine (SEROQUEL) 50 MG tablet Take 3 tablets (150 mg total) by mouth 2 (two) times daily. 04/05/15   Arthor Captain, PA-C    Physical Exam: Vitals:   12/07/20 1141 12/07/20 1452 12/07/20 1752 12/07/20 2008  BP:  (!) 139/100 119/88 93/81  Pulse:  75 90 87  Resp:  16 16 16   Temp: 98.9 F (37.2 C)   99.1 F (37.3 C)  TempSrc:    Oral  SpO2:  100% 100% 98%      Constitutional: Acutely ill looking no distress Vitals:   12/07/20 1141 12/07/20 1452 12/07/20 1752 12/07/20 2008  BP:  (!) 139/100 119/88 93/81  Pulse:  75 90 87  Resp:  16 16 16   Temp: 98.9 F (37.2 C)   99.1 F (37.3 C)  TempSrc:    Oral  SpO2:  100% 100% 98%   Eyes: PERRL, lids and conjunctivae normal ENMT: Mucous membranes are dry. Posterior pharynx clear of any exudate or lesions.Normal dentition.  Neck: normal, supple, no masses, no thyromegaly Respiratory: clear to auscultation bilaterally, no wheezing, no crackles. Normal respiratory effort. No accessory muscle use.  Cardiovascular: Regular rate and rhythm, no murmurs / rubs / gallops. No extremity edema. 2+ pedal pulses. No carotid bruits.  Abdomen: no tenderness, no masses palpated. No hepatosplenomegaly. Bowel sounds positive.   Musculoskeletal: no clubbing / cyanosis. No joint deformity upper and lower extremities. Good ROM, no contractures. Normal muscle tone.  Skin: no rashes, lesions, ulcers. No induration Neurologic: CN 2-12 grossly intact. Sensation intact, DTR normal. Strength 5/5 in all 4.  Psychiatric: Normal judgment and insight. Alert and oriented x 3.  Depressed mood.     Labs on Admission: I have personally reviewed following labs and imaging studies  CBC: Recent Labs  Lab 12/07/20 1156  WBC 8.8  NEUTROABS 5.5  HGB 16.0  HCT 50.0  MCV 99.2  PLT 178   Basic Metabolic Panel: Recent Labs  Lab 12/07/20 1156  NA 141  K 3.7  CL 105  CO2 18*  GLUCOSE 102*  BUN 13  CREATININE 1.77*  CALCIUM 9.9  MG 2.4   GFR: CrCl cannot be calculated (Unknown ideal weight.). Liver Function Tests: Recent Labs  Lab 12/07/20 1156  AST 129*  ALT 110*  ALKPHOS 47  BILITOT 0.6  PROT 7.4  ALBUMIN 4.2   No results for input(s): LIPASE, AMYLASE in the last 168 hours. No results for input(s): AMMONIA in the last 168 hours. Coagulation Profile: No results for input(s): INR, PROTIME in the last 168 hours. Cardiac Enzymes: Recent Labs  Lab 12/07/20 1156  CKTOTAL 1,061*   BNP (last 3 results) No results for input(s): PROBNP in the last 8760 hours. HbA1C: No results for input(s): HGBA1C in the last 72 hours. CBG: No results for input(s): GLUCAP in the last 168 hours. Lipid Profile: No results for input(s): CHOL, HDL, LDLCALC, TRIG, CHOLHDL, LDLDIRECT in the last 72 hours. Thyroid Function Tests: No results for input(s): TSH, T4TOTAL, FREET4, T3FREE, THYROIDAB in the last 72 hours. Anemia Panel: No results for input(s): VITAMINB12, FOLATE, FERRITIN, TIBC, IRON, RETICCTPCT in the last 72 hours. Urine analysis:    Component Value Date/Time   COLORURINE YELLOW 12/10/2012 1735   APPEARANCEUR CLEAR 12/10/2012 1735   LABSPEC 1.012 12/10/2012 1735   PHURINE 6.0 12/10/2012 1735   GLUCOSEU NEGATIVE  12/10/2012 1735   HGBUR NEGATIVE 12/10/2012 1735   BILIRUBINUR NEGATIVE 12/10/2012 1735   KETONESUR NEGATIVE 12/10/2012 1735   PROTEINUR NEGATIVE 12/10/2012 1735   UROBILINOGEN 0.2 12/10/2012 1735   NITRITE NEGATIVE 12/10/2012 1735   LEUKOCYTESUR NEGATIVE 12/10/2012 1735   Sepsis Labs: @LABRCNTIP (procalcitonin:4,lacticidven:4) )No results found for this or any previous visit (from the past 240 hour(s)).   Radiological Exams on Admission: No results found.   Assessment/Plan Principal Problem:   Rhabdomyolysis Active Problems:   HTN (hypertension)   Alcoholism (HCC)   AKI (acute kidney injury) (HCC)     #1 mild rhabdomyolysis: Secondary to excessive heat exposure, alcoholism as well as dehydration.  Patient will be admitted for observation.  Aggressively hydrate.  Monitor response.  With serial CPK.  #  2 AKI: Most likely prerenal.  Aggressively hydrate and monitor renal function.  If no improvement will get renal ultrasound.  #3 alcohol abuse: Watch for possible withdrawal symptoms.  If present initiate CIWA protocol.  #4 essential hypertension: Patient not on any blood pressure medications at home.  At this point we will observe in the setting of dehydration.  Will treat only if blood pressure rises.  #5 history of asthma: Taking albuterol as needed.  May resume that.   DVT prophylaxis: Lovenox Code Status: Full code Family Communication: No family at bedside Disposition Plan: Home Consults called: None Admission status: Inpatient  Severity of Illness: The appropriate patient status for this patient is INPATIENT. Inpatient status is judged to be reasonable and necessary in order to provide the required intensity of service to ensure the patient's safety. The patient's presenting symptoms, physical exam findings, and initial radiographic and laboratory data in the context of their chronic comorbidities is felt to place them at high risk for further clinical deterioration.  Furthermore, it is not anticipated that the patient will be medically stable for discharge from the hospital within 2 midnights of admission. The following factors support the patient status of inpatient.   " The patient's presenting symptoms include possible insect bite. " The worrisome physical exam findings include dehydration. " The initial radiographic and laboratory data are worrisome because of elevated CPK and renal function. " The chronic co-morbidities include alcoholism.   * I certify that at the point of admission it is my clinical judgment that the patient will require inpatient hospital care spanning beyond 2 midnights from the point of admission due to high intensity of service, high risk for further deterioration and high frequency of surveillance required.Lonia Blood MD [facility] Pager [phone]  If 7PM-7AM, please contact night-coverage [email or web address] Password [email or web address]  12/07/2020, 10:31 PM

## 2020-12-07 NOTE — ED Provider Notes (Signed)
Emergency Medicine Provider Triage Evaluation Note  ERMAN [name] , a 60 y.o. male  was evaluated in triage.  Pt complains of bee sting.  History given by patient and EMS.  EMS reports that when they got there patient was diaphoretic but had clear lung sounds, no swelling, no episodes of syncope or vomiting.  He was not given any epinephrine prior to arrival.  He is complaining of severe cramping in his legs.  He has no history of allergies.  No hives.  Review of Systems  Positive: Bee sting Negative: Shortness of breath  Physical Exam  BP (!) 163/137   Pulse 80   Temp 98.9 F (37.2 C)   Resp (!) 23   SpO2 98%  Gen:   Awake, no distress   Resp:  Normal effort  MSK:   Moves extremities without difficulty  Other:  No wheezing, no hives.  Medical Decision Making  Medically screening exam initiated at 11:46 AM.  Appropriate orders placed.  Lorin Mercy was informed that the remainder of the evaluation will be completed by another provider, this initial triage assessment does not replace that evaluation, and the importance of remaining in the ED until their evaluation is complete.  Patient with bee sting.  He was out in the heat.  Question heat injury, MI.  He does not appear to have anaphylaxis.  Labs and EKG ordered.   Arthor Captain, PA-C 12/07/20 1148    Tanda Rockers A, DO 12/07/20 [phone]

## 2020-12-07 NOTE — ED Notes (Signed)
Pt stepped outside for a few minutes

## 2020-12-07 NOTE — ED Notes (Signed)
RN notified by [facility] that pt called them to ask for a dinner tray, despite being in the waiting room.

## 2020-12-07 NOTE — ED Triage Notes (Signed)
Pt here via ems after being stung by a bee on the nose. Pt diaphoretic on scene. Pt alert and oriented. Pt appears anxious and complains of bil leg cramps. VSS with ems.

## 2020-12-07 NOTE — ED Provider Notes (Signed)
[facility] EMERGENCY DEPARTMENT Provider Note   CSN: [id number] Arrival date & time: 12/07/20  1128     History Chief Complaint  Patient presents with   Leg Pain   Insect Bite    Xavier [name] is a 60 y.o. male.  Xavier [name] is a 60 y.o. male with a history of asthma, hypertension, back pain, depression and alcohol abuse, who presents to the emergency department via EMS after he reportedly was stung by a bee.  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  Started experiencing some persistent and worsening leg cramping today and then was also stung at least 2 times by a bee.  Reports he was stung on his nose and the back of his head.  When EMS arrived patient was noted to be a bit diaphoretic but did not have any facial swelling, difficulty breathing, syncope or vomiting.  He did not receive epi or any medications prior to arrival.  No known history of bee allergy.  Patient currently complains more so with persistent muscle cramping in bilateral legs.  Reports he is not drinking water while out working in the heat the last few days, does report drinking several beers daily, last drink yesterday.  Also endorses using cocaine yesterday, denies other drug use.  No other aggravating or alleviating factors.  The history is provided by the patient.      Past Medical History:  Diagnosis Date   Asthma    Back pain    Depression    Hypertension     Patient Active Problem List   Diagnosis Date Noted   Rhabdomyolysis 12/07/2020   AKI (acute kidney injury) (HCC) 12/07/2020   Narcotic overdose (HCC) 12/10/2012   HTN (hypertension) 12/10/2012   Alcoholism (HCC) 12/10/2012   Acute respiratory failure (HCC) 12/10/2012    Past Surgical History:  Procedure Laterality Date   HAND TENDON SURGERY         No family history on file.  Social History   Tobacco Use   Smoking status: Every Day    Packs/day:  1.00    Types: Cigarettes   Smokeless tobacco: Never  Substance Use Topics   Alcohol use: Yes    Comment: occasionally    Drug use: No    Home Medications Prior to Admission medications   Medication Sig Start Date End Date Taking? Authorizing Provider  albuterol (VENTOLIN HFA) 108 (90 Base) MCG/ACT inhaler Inhale 2 puffs into the lungs every 6 (six) hours as needed for wheezing. 09/30/18   Hall-Potvin, [place], PA-C  oxyCODONE-acetaminophen (PERCOCET/ROXICET) 5-325 MG tablet Take 1 tablet by mouth every 4 (four) hours as needed for severe pain. Patient not taking: Reported on 06/25/2015 04/05/15   Arthor Captain, PA-C  QUEtiapine (SEROQUEL) 50 MG tablet Take 3 tablets (150 mg total) by mouth 2 (two) times daily. 04/05/15   Arthor Captain, PA-C    Allergies    Nsaids  Review of Systems   Review of Systems  Constitutional:  Negative for chills and fever.  HENT: Negative.  Negative for facial swelling, sore throat and trouble swallowing.   Respiratory:  Negative for cough and shortness of breath.   Cardiovascular:  Negative for chest pain.  Gastrointestinal:  Negative for abdominal pain, nausea and vomiting.  Genitourinary:  Negative for dysuria.  Musculoskeletal:  Positive for myalgias.  Skin:  Negative for rash.  Neurological:  Negative for dizziness, syncope  and light-headedness.  All other systems reviewed and are negative.  Physical Exam Updated Vital Signs BP 93/81 (BP Location: Left Arm)   Pulse 87   Temp 99.1 F (37.3 C) (Oral)   Resp 16   SpO2 98%   Physical Exam Vitals and nursing note reviewed.  Constitutional:      General: He is not in acute distress.    Appearance: Normal appearance. He is well-developed. He is not diaphoretic.  HENT:     Head: Normocephalic and atraumatic.     Comments: No facial swelling from bee sting    Mouth/Throat:     Comments: Mucous membranes slightly dry no swelling or angioedema, posterior oropharynx clear, tolerating  secretions, normal phonation Eyes:     General:        Right eye: No discharge.        Left eye: No discharge.  Cardiovascular:     Rate and Rhythm: Normal rate and regular rhythm.     Pulses: Normal pulses.     Heart sounds: Normal heart sounds. No murmur heard.   No friction rub. No gallop.  Pulmonary:     Effort: Pulmonary effort is normal. No respiratory distress.     Breath sounds: Normal breath sounds. No wheezing or rales.     Comments: Respirations equal and unlabored, patient able to speak in full sentences, lungs clear to auscultation bilaterally  Abdominal:     General: Bowel sounds are normal. There is no distension.     Palpations: Abdomen is soft. There is no mass.     Tenderness: There is no abdominal tenderness. There is no guarding.     Comments: Abdomen soft, nondistended, nontender to palpation in all quadrants without guarding or peritoneal signs  Musculoskeletal:        General: No deformity.     Cervical back: Neck supple.  Skin:    General: Skin is warm and dry.     Capillary Refill: Capillary refill takes less than 2 seconds.  Neurological:     Mental Status: He is alert and oriented to person, place, and time.     Coordination: Coordination normal.     Comments: Speech is clear, able to follow commands Moves extremities without ataxia, coordination intact  Psychiatric:        Mood and Affect: Mood normal.        Behavior: Behavior normal.    ED Results / Procedures / Treatments   Labs (all labs ordered are listed, but only abnormal results are displayed) Labs Reviewed  COMPREHENSIVE METABOLIC PANEL - Abnormal; Notable for the following components:      Result Value   CO2 18 (*)    Glucose, Bld 102 (*)    Creatinine, Ser 1.77 (*)    AST 129 (*)    ALT 110 (*)    GFR, Estimated 44 (*)    Anion gap 18 (*)    All other components within normal limits  CK - Abnormal; Notable for the following components:   Total CK 1,061 (*)    All other  components within normal limits  RESP PANEL BY RT-PCR (FLU A&B, COVID) ARPGX2  CBC WITH DIFFERENTIAL/PLATELET  MAGNESIUM  URINALYSIS, ROUTINE W REFLEX MICROSCOPIC  ETHANOL  HIV ANTIBODY (ROUTINE TESTING W REFLEX)  CREATININE, SERUM  CBC  COMPREHENSIVE METABOLIC PANEL  CK  MAGNESIUM  PHOSPHORUS  TROPONIN I (HIGH SENSITIVITY)  TROPONIN I (HIGH SENSITIVITY)    EKG None  Radiology No results found.  Procedures  Procedures   Medications Ordered in ED Medications  sodium chloride 0.9 % bolus 1,000 mL (has no administration in time range)  heparin injection 5,000 Units (has no administration in time range)  dextrose 5 % in lactated ringers infusion (has no administration in time range)  ondansetron (ZOFRAN) tablet 4 mg (has no administration in time range)    Or  ondansetron (ZOFRAN) injection 4 mg (has no administration in time range)  LORazepam (ATIVAN) tablet 1-4 mg (has no administration in time range)    Or  LORazepam (ATIVAN) injection 1-4 mg (has no administration in time range)  thiamine tablet 100 mg (has no administration in time range)    Or  thiamine (B-1) injection 100 mg (has no administration in time range)  folic acid (FOLVITE) tablet 1 mg (has no administration in time range)  multivitamin with minerals tablet 1 tablet (has no administration in time range)    ED Course  I have reviewed the triage vital signs and the nursing notes.  Pertinent labs & imaging results that were available during my care of the patient were reviewed by me and considered in my medical decision making (see chart for details).    MDM Rules/Calculators/A&P                          60 year old male initially admitted for antibiotics due to reported bee sting and has no signs of anaphylaxis or reaction, but is also been complaining of severe bilateral lower extremity cramping without edema.  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  Labs ordered from triage with concern for potential rhabdo or heat exposure.  I have independently ordered, reviewed and interpreted all labs and imaging: CBC: No leukocytosis and normal hemoglobin CMP: CO2 slightly decreased at 18, glucose 102, creatinine acutely elevated at 1.77, previous baseline of 0.9, patient also with mildly elevated AST and ALT.  Mildly elevated anion gap of 18 CK: 1061 Troponin: WNL Magnesium: WNL  Patient with AKI with elevated CK, concerning for rhabdomyolysis likely in the setting of cocaine use and heat exposure.  Will need admission for IV rehydration and monitoring of CK and creatinine level.  Case discussed with Dr. Mikeal Hawthorne with [facility] who will see and admit the patient.  Final Clinical Impression(s) / ED Diagnoses Final diagnoses:  Non-traumatic rhabdomyolysis  AKI (acute kidney injury) [facility])    Rx / DC Orders ED Discharge Orders     None        Dartha Lodge, [place] 12/07/20 2346    Maia Plan, MD 12/10/20 [phone]

## 2020-12-08 ENCOUNTER — Encounter (HOSPITAL_COMMUNITY): Payer: Self-pay | Admitting: Internal Medicine

## 2020-12-08 ENCOUNTER — Other Ambulatory Visit: Payer: Self-pay

## 2020-12-08 DIAGNOSIS — I10 Essential (primary) hypertension: Secondary | ICD-10-CM | POA: Diagnosis not present

## 2020-12-08 DIAGNOSIS — N179 Acute kidney failure, unspecified: Secondary | ICD-10-CM | POA: Diagnosis not present

## 2020-12-08 DIAGNOSIS — F1020 Alcohol dependence, uncomplicated: Secondary | ICD-10-CM | POA: Diagnosis not present

## 2020-12-08 DIAGNOSIS — M6282 Rhabdomyolysis: Secondary | ICD-10-CM | POA: Diagnosis not present

## 2020-12-08 LAB — COMPREHENSIVE METABOLIC PANEL
ALT: 81 U/L — ABNORMAL HIGH (ref 0–44)
AST: 79 U/L — ABNORMAL HIGH (ref 15–41)
Albumin: 3.1 g/dL — ABNORMAL LOW (ref 3.5–5.0)
Alkaline Phosphatase: 48 U/L (ref 38–126)
Anion gap: 6 (ref 5–15)
BUN: 17 mg/dL (ref 6–20)
CO2: 22 mmol/L (ref 22–32)
Calcium: 8.4 mg/dL — ABNORMAL LOW (ref 8.9–10.3)
Chloride: 110 mmol/L (ref 98–111)
Creatinine, Ser: 1.17 mg/dL (ref 0.61–1.24)
GFR, Estimated: >60 mL/min (ref >60)
Glucose, Bld: 118 mg/dL — ABNORMAL HIGH (ref 70–99)
Potassium: 3.8 mmol/L (ref 3.5–5.1)
Sodium: 138 mmol/L (ref 135–145)
Total Bilirubin: 0.7 mg/dL (ref 0.3–1.2)
Total Protein: 5.7 g/dL — ABNORMAL LOW (ref 6.5–8.1)

## 2020-12-08 LAB — URINALYSIS, ROUTINE W REFLEX MICROSCOPIC
Bilirubin Urine: NEGATIVE
Glucose, UA: NEGATIVE mg/dL
Hgb urine dipstick: NEGATIVE
Ketones, ur: NEGATIVE mg/dL
Nitrite: NEGATIVE
Protein, ur: 30 mg/dL — AB
Specific Gravity, Urine: 1.026 (ref 1.005–1.030)
pH: 5 (ref 5.0–8.0)

## 2020-12-08 LAB — RESP PANEL BY RT-PCR (FLU A&B, COVID) ARPGX2
Influenza A by PCR: NEGATIVE
Influenza B by PCR: NEGATIVE
SARS Coronavirus 2 by RT PCR: NEGATIVE

## 2020-12-08 LAB — CBC
HCT: 41.1 % (ref 39.0–52.0)
Hemoglobin: 14.1 g/dL (ref 13.0–17.0)
MCH: 32.3 pg (ref 26.0–34.0)
MCHC: 34.3 g/dL (ref 30.0–36.0)
MCV: 94.1 fL (ref 80.0–100.0)
Platelets: 178 10*3/uL (ref 150–400)
RBC: 4.37 MIL/uL (ref 4.22–5.81)
RDW: 13.6 % (ref 11.5–15.5)
WBC: 8.1 10*3/uL (ref 4.0–10.5)
nRBC: 0 % (ref 0.0–0.2)

## 2020-12-08 LAB — MAGNESIUM: Magnesium: 2.2 mg/dL (ref 1.7–2.4)

## 2020-12-08 LAB — CREATININE, SERUM
Creatinine, Ser: 1.21 mg/dL (ref 0.61–1.24)
GFR, Estimated: >60 mL/min (ref >60)

## 2020-12-08 LAB — CK: Total CK: 751 U/L — ABNORMAL HIGH (ref 49–397)

## 2020-12-08 LAB — HIV ANTIBODY (ROUTINE TESTING W REFLEX): HIV Screen 4th Generation wRfx: NONREACTIVE

## 2020-12-08 LAB — PHOSPHORUS: Phosphorus: 4.1 mg/dL (ref 2.5–4.6)

## 2020-12-08 LAB — ETHANOL: Alcohol, Ethyl (B): <10 mg/dL (ref <10)

## 2020-12-08 MED ORDER — ALBUTEROL SULFATE (2.5 MG/3ML) 0.083% IN NEBU: 3.0000 mL | INHALATION_SOLUTION | Freq: Four times a day (QID) | RESPIRATORY_TRACT | Status: DC | PRN

## 2020-12-08 MED ORDER — QUETIAPINE FUMARATE 25 MG PO TABS
150.0000 mg | ORAL_TABLET | Freq: Two times a day (BID) | ORAL | Status: DC
  Administered 2020-12-08 – 2020-12-09 (×3): 150 mg via ORAL
  Filled 2020-12-08 (×3): qty 2

## 2020-12-08 NOTE — Plan of Care (Signed)
  Problem: Health Behavior/Discharge Planning: Goal: Ability to manage health-related needs will improve Outcome: Progressing   Problem: Nutrition: Goal: Adequate nutrition will be maintained Outcome: Progressing   Problem: Safety: Goal: Ability to remain free from injury will improve Outcome: Progressing   

## 2020-12-08 NOTE — Progress Notes (Signed)
PROGRESS NOTE    Xavier [name]  SAY:[id number] DOB: 23-Feb-1961 DOA: 12/07/2020 PCP: Pcp, No   Brief Narrative:  The patient is a 60 year old African-American male with a past medical history significant for but not limited to alcohol abuse, asthma, hypertension, depression as well as other comorbidities who presented the ED complaining of being stung by a bee.  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  He been working in the yard for most of the day try to drink some fluids but not adequately.  He started experiencing worsening leg cramping and was stung at least twice by a bee.  He remembers being stung in the nose and the back of the head.  EMS was called and they arrived he was diaphoretic as well as weak.  He appeared very dehydrated there is no evidence of any anaphylactic reaction and he did not get treated for that.  He was brought to the ED and was noted to have an AKI as well as elevated CPK.  Additionally he endorsed taking cocaine yesterday as well as drinking a number of beers.  Patient was admitted with mild rhabdomyolysis and prerenal azotemia which was complicated by alcohol abuse and cocaine abuse.  Assessment & Plan:   Principal Problem:   Rhabdomyolysis Active Problems:   HTN (hypertension)   Alcoholism (HCC)   AKI (acute kidney injury) (HCC)  Rhabdomyolysis -Mild. CK is trending down and went from 1061 -> 751 -In the setting of Heat Exhaustion as he works as a Administrator and was in the heat all day and having inadequate Fluid Hydration -C/w IVF Hydration but Reduce the Rate from D5 in LR from 250 mL/hr to 125 mL/hr -Continue to Monitor and trend and repeat CK in the AM  AKI -Likely Prerenal in [place] -Improving as Patient's BUN/Cr went from 13/1.77 -> 17/1.17 -C/w IVF Hydration as above -Avoid Nephrotoxic Medications, Contrast Dyes, Hypotension and Renally adjust medications -Continue to Monitor and Trend Renal Fxn Carefully and repeat CMP in  the AM  Elevated Anion Gap Metabolic Acidosis -Mild and Improved -On Admission patient had a CO2 of 18, AG of 18, and a Chloride Level of 105 -Now CO2 is 22, AG is 6, and Chloride Level is 110 -Continue to Monitor and Trend and Repeat CMP in the AM  Abnormal LFT's -In the setting of Alcoholism/Alcohol Abuse -Patient's AST went from 129 -> 79 and ALT went from 110 -> 81 -Continue to Monitor and Trend and Avoid Hepatotoxic Medications -Repeat CMP in the AM  Alcoholism/Alcohol Abuse with Concern for Withdrawal -CIWA Protocol Initiated with IV Lorazepam -Had been drinking quite a bit of Beers the day prior to admission -C/w Folic Acid, MVI+Minerals, and Thiamine Supplementation  Polysubstance Abuse -Admitted to doing Cocaine the day Prior to Admission  -Counseling given  Essential Hypertension -BP was 137/91 -Continue to Monitor BP per Protocol -If necessary will place on IV Hydrlazine prn  Asthma -C/w Albuterol Inhaler 2 puff q6hprn  -Currently not in Exacerbation   Depression -C/w Home Seroquel 150 mg po BID   Hyperglycemia -Patient's Blood Sugar on Admission was 102 on CMP and now 118 this AM -Check HbA1c  -Continue to Monitor and Trend Blood Sugars carefully as patient is on D5 in LR at 125 mL/hr and if Necessary will place on Sensitive Novolog SSI AC   DVT prophylaxis: Heparin 5,000 units sq q8h Code Status: FULL CODE Family Communication: No Family present at bedside  Disposition Plan:  Anticipating D/Cing Home in the next 24-48 hours  Status is: Inpatient  Remains inpatient appropriate because:Unsafe d/c plan, IV treatments appropriate due to intensity of illness or inability to take PO, and Inpatient level of care appropriate due to severity of illness  Dispo: The patient is from: Home              Anticipated d/c is to: Home              Patient currently is not medically stable to d/c.   Difficult to place patient No  Consultants:  None  Procedures:  None  Antimicrobials:  Anti-infectives (From admission, onward)    None        Subjective: Seen and examined at bedside and was still complaining about some leg cramping.  No nausea or vomiting.  Feels a little bit better.  Denies any chest pain or shortness of breath.  No other concerns or complaints at this time.  Objective: Vitals:   12/07/20 2355 12/08/20 0212 12/08/20 0235 12/08/20 0300  BP: 132/79 119/88 134/89   Pulse: 75 80 80   Resp:  16 18   Temp:  98.2 F (36.8 C) 98 F (36.7 C)   TempSrc:   Oral   SpO2:  99% 100%   Weight:    68 kg  Height:    5\' 7"  (1.702 m)    Intake/Output Summary (Last 24 hours) at 12/08/2020 [id number] Last data filed at 12/08/2020 0700 Gross per 24 hour  Intake 1000 ml  Output 2 ml  Net 998 ml   Filed Weights   12/08/20 0300  Weight: 68 kg   Examination: Physical Exam:  Constitutional: WN/WD Thin AAM in NAD and appears calm and comfortable Eyes: Lids and conjunctivae normal, sclerae anicteric  ENMT: External Ears, Nose appear normal. Grossly normal hearing.  Neck: Appears normal, supple, no cervical masses, normal ROM, no appreciable thyromegaly; no JVD Respiratory: Diminished to auscultation bilaterally, no wheezing, rales, rhonchi or crackles. Normal respiratory effort and patient is not tachypenic. No accessory muscle use. Unlabored breathing  Cardiovascular: RRR, no murmurs / rubs / gallops. S1 and S2 auscultated. No extremity edema.  Abdomen: Soft, non-tender, non-distended. Bowel sounds positive.  GU: Deferred. Musculoskeletal: No clubbing / cyanosis of digits/nails. No joint deformity upper and lower extremities.  Skin: No rashes, lesions, ulcers on a limited skin evaluation. No induration; Warm and dry.  Neurologic: CN 2-12 grossly intact with no focal deficits. Romberg sign cerebellar reflexes not assessed.  Psychiatric: Normal judgment and insight. Alert and oriented x 3. Normal mood and appropriate affect.   Data Reviewed: I  have personally reviewed following labs and imaging studies  CBC: Recent Labs  Lab 12/07/20 1156 12/08/20 0511  WBC 8.8 8.1  NEUTROABS 5.5  --   HGB 16.0 14.1  HCT 50.0 41.1  MCV 99.2 94.1  PLT 178 178   Basic Metabolic Panel: Recent Labs  Lab 12/07/20 1156 12/07/20 2345 12/08/20 0511  NA 141  --  138  K 3.7  --  3.8  CL 105  --  110  CO2 18*  --  22  GLUCOSE 102*  --  118*  BUN 13  --  17  CREATININE 1.77* 1.21 1.17  CALCIUM 9.9  --  8.4*  MG 2.4 2.2  --   PHOS  --  4.1  --    GFR: Estimated Creatinine Clearance: 64.3 mL/min (by C-G formula based on SCr of 1.17 mg/dL). Liver Function Tests: Recent  Labs  Lab 12/07/20 1156 12/08/20 0511  AST 129* 79*  ALT 110* 81*  ALKPHOS 47 48  BILITOT 0.6 0.7  PROT 7.4 5.7*  ALBUMIN 4.2 3.1*   No results for input(s): LIPASE, AMYLASE in the last 168 hours. No results for input(s): AMMONIA in the last 168 hours. Coagulation Profile: No results for input(s): INR, PROTIME in the last 168 hours. Cardiac Enzymes: Recent Labs  Lab 12/07/20 1156 12/08/20 0511  CKTOTAL 1,061* 751*   BNP (last 3 results) No results for input(s): PROBNP in the last 8760 hours. HbA1C: No results for input(s): HGBA1C in the last 72 hours. CBG: No results for input(s): GLUCAP in the last 168 hours. Lipid Profile: No results for input(s): CHOL, HDL, LDLCALC, TRIG, CHOLHDL, LDLDIRECT in the last 72 hours. Thyroid Function Tests: No results for input(s): TSH, T4TOTAL, FREET4, T3FREE, THYROIDAB in the last 72 hours. Anemia Panel: No results for input(s): VITAMINB12, FOLATE, FERRITIN, TIBC, IRON, RETICCTPCT in the last 72 hours. Sepsis Labs: No results for input(s): PROCALCITON, LATICACIDVEN in the last 168 hours.  Recent Results (from the past 240 hour(s))  Resp Panel by RT-PCR (Flu A&B, Covid) Nasopharyngeal Swab     Status: None   Collection Time: 12/07/20 11:48 PM   Specimen: Nasopharyngeal Swab; Nasopharyngeal(NP) swabs in vial transport  medium  Result Value Ref Range Status   SARS Coronavirus 2 by RT PCR NEGATIVE NEGATIVE Final    Comment: (NOTE) SARS-CoV-2 target nucleic acids are NOT DETECTED.  The SARS-CoV-2 RNA is generally detectable in upper respiratory specimens during the acute phase of infection. The lowest concentration of SARS-CoV-2 viral copies this assay can detect is 138 copies/mL. A negative result does not preclude SARS-Cov-2 infection and should not be used as the sole basis for treatment or other patient management decisions. A negative result may occur with  improper specimen collection/handling, submission of specimen other than nasopharyngeal swab, presence of viral mutation(s) within the areas targeted by this assay, and inadequate number of viral copies(<138 copies/mL). A negative result must be combined with clinical observations, patient history, and epidemiological information. The expected result is Negative.  Fact Sheet for Patients:  BloggerCourse.com  Fact Sheet for Healthcare Providers:  SeriousBroker.it  This test is no t yet approved or cleared by the [place] FDA and  has been authorized for detection and/or diagnosis of SARS-CoV-2 by FDA under an Emergency Use Authorization (EUA). This EUA will remain  in effect (meaning this test can be used) for the duration of the COVID-19 declaration under Section 564(b)(1) of the Act, 21 U.S.C.section 360bbb-3(b)(1), unless the authorization is terminated  or revoked sooner.       Influenza A by PCR NEGATIVE NEGATIVE Final   Influenza B by PCR NEGATIVE NEGATIVE Final    Comment: (NOTE) The Xpert Xpress SARS-CoV-2/FLU/RSV plus assay is intended as an aid in the diagnosis of influenza from Nasopharyngeal swab specimens and should not be used as a sole basis for treatment. Nasal washings and aspirates are unacceptable for Xpert Xpress SARS-CoV-2/FLU/RSV testing.  Fact Sheet for  Patients: BloggerCourse.com  Fact Sheet for Healthcare Providers: SeriousBroker.it  This test is not yet approved or cleared by the [place] FDA and has been authorized for detection and/or diagnosis of SARS-CoV-2 by FDA under an Emergency Use Authorization (EUA). This EUA will remain in effect (meaning this test can be used) for the duration of the COVID-19 declaration under Section 564(b)(1) of the Act, 21 U.S.C. section 360bbb-3(b)(1), unless the authorization is terminated  or revoked.  Performed at [facility], [address] [place], [place] [address]     RN Pressure Injury Documentation:     Estimated body mass index is 23.48 kg/m as calculated from the following:   Height as of this encounter: 5\' 7"  (1.702 m).   Weight as of this encounter: 68 kg.  Malnutrition Type:   Malnutrition Characteristics:   Nutrition Interventions:     Radiology Studies: No results found.  Scheduled Meds:  folic acid  1 mg Oral Daily   heparin  5,000 Units Subcutaneous Q8H   multivitamin with minerals  1 tablet Oral Daily   thiamine  100 mg Oral Daily   Or   thiamine  100 mg Intravenous Daily   Continuous Infusions:  dextrose 5% lactated ringers 250 mL/hr at 12/08/20 02/07/21    LOS: 1 day   [id number], DO [facility] PAGER is on AMION  If 7PM-7AM, please contact night-coverage [email or web address]

## 2020-12-09 ENCOUNTER — Other Ambulatory Visit (HOSPITAL_COMMUNITY): Payer: Self-pay

## 2020-12-09 DIAGNOSIS — F1020 Alcohol dependence, uncomplicated: Secondary | ICD-10-CM | POA: Diagnosis not present

## 2020-12-09 DIAGNOSIS — E872 Acidosis: Secondary | ICD-10-CM

## 2020-12-09 DIAGNOSIS — R945 Abnormal results of liver function studies: Secondary | ICD-10-CM

## 2020-12-09 DIAGNOSIS — F1910 Other psychoactive substance abuse, uncomplicated: Secondary | ICD-10-CM

## 2020-12-09 DIAGNOSIS — M6282 Rhabdomyolysis: Secondary | ICD-10-CM | POA: Diagnosis not present

## 2020-12-09 DIAGNOSIS — N179 Acute kidney failure, unspecified: Secondary | ICD-10-CM | POA: Diagnosis not present

## 2020-12-09 DIAGNOSIS — I10 Essential (primary) hypertension: Secondary | ICD-10-CM | POA: Diagnosis not present

## 2020-12-09 LAB — COMPREHENSIVE METABOLIC PANEL
ALT: 66 U/L — ABNORMAL HIGH (ref 0–44)
AST: 58 U/L — ABNORMAL HIGH (ref 15–41)
Albumin: 2.5 g/dL — ABNORMAL LOW (ref 3.5–5.0)
Alkaline Phosphatase: 39 U/L (ref 38–126)
Anion gap: 3 — ABNORMAL LOW (ref 5–15)
BUN: 10 mg/dL (ref 6–20)
CO2: 26 mmol/L (ref 22–32)
Calcium: 8.4 mg/dL — ABNORMAL LOW (ref 8.9–10.3)
Chloride: 110 mmol/L (ref 98–111)
Creatinine, Ser: 1.07 mg/dL (ref 0.61–1.24)
GFR, Estimated: >60 mL/min (ref >60)
Glucose, Bld: 116 mg/dL — ABNORMAL HIGH (ref 70–99)
Potassium: 4.1 mmol/L (ref 3.5–5.1)
Sodium: 139 mmol/L (ref 135–145)
Total Bilirubin: 0.6 mg/dL (ref 0.3–1.2)
Total Protein: 4.8 g/dL — ABNORMAL LOW (ref 6.5–8.1)

## 2020-12-09 LAB — MAGNESIUM: Magnesium: 1.7 mg/dL (ref 1.7–2.4)

## 2020-12-09 LAB — CBC WITH DIFFERENTIAL/PLATELET
Abs Immature Granulocytes: 0.02 10*3/uL (ref 0.00–0.07)
Basophils Absolute: 0.1 10*3/uL (ref 0.0–0.1)
Basophils Relative: 1 %
Eosinophils Absolute: 0.5 10*3/uL (ref 0.0–0.5)
Eosinophils Relative: 8 %
HCT: 39.1 % (ref 39.0–52.0)
Hemoglobin: 13.1 g/dL (ref 13.0–17.0)
Immature Granulocytes: 0 %
Lymphocytes Relative: 44 %
Lymphs Abs: 2.8 10*3/uL (ref 0.7–4.0)
MCH: 31.7 pg (ref 26.0–34.0)
MCHC: 33.5 g/dL (ref 30.0–36.0)
MCV: 94.7 fL (ref 80.0–100.0)
Monocytes Absolute: 0.6 10*3/uL (ref 0.1–1.0)
Monocytes Relative: 10 %
Neutro Abs: 2.4 10*3/uL (ref 1.7–7.7)
Neutrophils Relative %: 37 %
Platelets: 163 10*3/uL (ref 150–400)
RBC: 4.13 MIL/uL — ABNORMAL LOW (ref 4.22–5.81)
RDW: 13.5 % (ref 11.5–15.5)
WBC: 6.5 10*3/uL (ref 4.0–10.5)
nRBC: 0 % (ref 0.0–0.2)

## 2020-12-09 LAB — CK: Total CK: 385 U/L (ref 49–397)

## 2020-12-09 LAB — HEMOGLOBIN A1C
Hgb A1c MFr Bld: 6 % — ABNORMAL HIGH (ref 4.8–5.6)
Mean Plasma Glucose: 125.5 mg/dL

## 2020-12-09 LAB — PHOSPHORUS: Phosphorus: 3.7 mg/dL (ref 2.5–4.6)

## 2020-12-09 MED ORDER — ALBUTEROL SULFATE HFA 108 (90 BASE) MCG/ACT IN AERS: 2.0000 | INHALATION_SPRAY | Freq: Four times a day (QID) | RESPIRATORY_TRACT | 0 refills | Status: DC | PRN

## 2020-12-09 MED ORDER — ALBUTEROL SULFATE HFA 108 (90 BASE) MCG/ACT IN AERS
2.0000 | INHALATION_SPRAY | Freq: Four times a day (QID) | RESPIRATORY_TRACT | 0 refills | Status: DC | PRN
Start: 2020-12-09 — End: 2021-08-10
  Filled 2020-12-09: qty 18, 25d supply, fill #0

## 2020-12-09 MED ORDER — THIAMINE HCL 100 MG PO TABS: 100.0000 mg | ORAL_TABLET | Freq: Every day | ORAL | 0 refills | Status: DC

## 2020-12-09 MED ORDER — ADULT MULTIVITAMIN W/MINERALS CH: 1.0000 | ORAL_TABLET | Freq: Every day | ORAL | 0 refills | Status: AC

## 2020-12-09 MED ORDER — THIAMINE HCL 100 MG PO TABS
100.0000 mg | ORAL_TABLET | Freq: Every day | ORAL | 0 refills | Status: AC
  Filled 2020-12-09: qty 30, 30d supply, fill #0

## 2020-12-09 MED ORDER — ALBUTEROL SULFATE (2.5 MG/3ML) 0.083% IN NEBU: 3.0000 mL | INHALATION_SOLUTION | Freq: Four times a day (QID) | RESPIRATORY_TRACT | Status: DC | PRN

## 2020-12-09 MED ORDER — FOLIC ACID 1 MG PO TABS: 1.0000 mg | ORAL_TABLET | Freq: Every day | ORAL | 0 refills | Status: DC

## 2020-12-09 MED ORDER — FOLIC ACID 1 MG PO TABS
1.0000 mg | ORAL_TABLET | Freq: Every day | ORAL | 0 refills | Status: AC
  Filled 2020-12-09: qty 30, 30d supply, fill #0

## 2020-12-09 MED ORDER — MAGNESIUM SULFATE 2 GM/50ML IV SOLN
2.0000 g | Freq: Once | INTRAVENOUS | Status: AC
  Administered 2020-12-09: 2 g via INTRAVENOUS
  Filled 2020-12-09: qty 50

## 2020-12-09 NOTE — Plan of Care (Signed)
Discharge teaching was provided to pt at the bedside. Pt verbalized understanding of teaching and stated that he would follow up with his appointment on 09/18. Patient's d/c packet and prescription was placed in his belongings before they were returned to him. Patient is currently stable and waiting for his transportation home.   Problem: Education: Goal: Knowledge of General Education information will improve Description: Including pain rating scale, medication(s)/side effects and non-pharmacologic comfort measures 12/09/2020 1414 by Marsh Dolly, RN Outcome: Adequate for Discharge 12/09/2020 1414 by Marsh Dolly, RN Outcome: Adequate for Discharge 12/09/2020 1412 by Marsh Dolly, RN Outcome: Adequate for Discharge 12/09/2020 1159 by Marsh Dolly, RN Outcome: Progressing   Problem: Health Behavior/Discharge Planning: Goal: Ability to manage health-related needs will improve 12/09/2020 1414 by Marsh Dolly, RN Outcome: Adequate for Discharge 12/09/2020 1414 by Marsh Dolly, RN Outcome: Adequate for Discharge 12/09/2020 1412 by Marsh Dolly, RN Outcome: Adequate for Discharge 12/09/2020 1159 by Marsh Dolly, RN Outcome: Progressing   Problem: Clinical Measurements: Goal: Ability to maintain clinical measurements within normal limits will improve 12/09/2020 1414 by Marsh Dolly, RN Outcome: Adequate for Discharge 12/09/2020 1414 by Marsh Dolly, RN Outcome: Adequate for Discharge 12/09/2020 1412 by Marsh Dolly, RN Outcome: Adequate for Discharge 12/09/2020 1159 by Marsh Dolly, RN Outcome: Progressing Goal: Will remain free from infection 12/09/2020 1414 by Marsh Dolly, RN Outcome: Adequate for Discharge 12/09/2020 1414 by Marsh Dolly, RN Outcome: Adequate for Discharge 12/09/2020 1412 by Marsh Dolly, RN Outcome: Adequate for Discharge Goal: Diagnostic test results will improve 12/09/2020 1414 by Marsh Dolly, RN Outcome: Adequate for  Discharge 12/09/2020 1414 by Marsh Dolly, RN Outcome: Adequate for Discharge 12/09/2020 1412 by Marsh Dolly, RN Outcome: Adequate for Discharge Goal: Respiratory complications will improve 12/09/2020 1414 by Marsh Dolly, RN Outcome: Adequate for Discharge 12/09/2020 1414 by Marsh Dolly, RN Outcome: Adequate for Discharge 12/09/2020 1412 by Marsh Dolly, RN Outcome: Adequate for Discharge Goal: Cardiovascular complication will be avoided 12/09/2020 1414 by Marsh Dolly, RN Outcome: Adequate for Discharge 12/09/2020 1414 by Marsh Dolly, RN Outcome: Adequate for Discharge 12/09/2020 1412 by Marsh Dolly, RN Outcome: Adequate for Discharge   Problem: Activity: Goal: Risk for activity intolerance will decrease 12/09/2020 1414 by Marsh Dolly, RN Outcome: Adequate for Discharge 12/09/2020 1414 by Marsh Dolly, RN Outcome: Adequate for Discharge 12/09/2020 1412 by Marsh Dolly, RN Outcome: Adequate for Discharge 12/09/2020 1159 by Marsh Dolly, RN Outcome: Progressing   Problem: Nutrition: Goal: Adequate nutrition will be maintained 12/09/2020 1414 by Marsh Dolly, RN Outcome: Adequate for Discharge 12/09/2020 1414 by Marsh Dolly, RN Outcome: Adequate for Discharge 12/09/2020 1412 by Marsh Dolly, RN Outcome: Adequate for Discharge 12/09/2020 1159 by Marsh Dolly, RN Outcome: Progressing   Problem: Coping: Goal: Level of anxiety will decrease 12/09/2020 1414 by Marsh Dolly, RN Outcome: Adequate for Discharge 12/09/2020 1414 by Marsh Dolly, RN Outcome: Adequate for Discharge 12/09/2020 1412 by Marsh Dolly, RN Outcome: Adequate for Discharge 12/09/2020 1159 by Marsh Dolly, RN Outcome: Progressing   Problem: Elimination: Goal: Will not experience complications related to bowel motility 12/09/2020 1414 by Marsh Dolly, RN Outcome: Adequate for Discharge 12/09/2020 1414 by Marsh Dolly, RN Outcome: Adequate for  Discharge 12/09/2020 1412 by Marsh Dolly, RN Outcome: Adequate for Discharge 12/09/2020 1159 by Marsh Dolly, RN Outcome: Progressing  Goal: Will not experience complications related to urinary retention 12/09/2020 1414 by Marsh Dolly, RN Outcome: Adequate for Discharge 12/09/2020 1414 by Marsh Dolly, RN Outcome: Adequate for Discharge 12/09/2020 1412 by Marsh Dolly, RN Outcome: Adequate for Discharge 12/09/2020 1159 by Marsh Dolly, RN Outcome: Progressing   Problem: Pain Managment: Goal: General experience of comfort will improve 12/09/2020 1414 by Marsh Dolly, RN Outcome: Adequate for Discharge 12/09/2020 1414 by Marsh Dolly, RN Outcome: Adequate for Discharge 12/09/2020 1412 by Marsh Dolly, RN Outcome: Adequate for Discharge 12/09/2020 1159 by Marsh Dolly, RN Outcome: Progressing   Problem: Safety: Goal: Ability to remain free from injury will improve 12/09/2020 1414 by Marsh Dolly, RN Outcome: Adequate for Discharge 12/09/2020 1414 by Marsh Dolly, RN Outcome: Adequate for Discharge 12/09/2020 1412 by Marsh Dolly, RN Outcome: Adequate for Discharge 12/09/2020 1159 by Marsh Dolly, RN Outcome: Progressing   Problem: Skin Integrity: Goal: Risk for impaired skin integrity will decrease 12/09/2020 1414 by Marsh Dolly, RN Outcome: Adequate for Discharge 12/09/2020 1414 by Marsh Dolly, RN Outcome: Adequate for Discharge 12/09/2020 1412 by Marsh Dolly, RN Outcome: Adequate for Discharge

## 2020-12-09 NOTE — Evaluation (Signed)
Physical Therapy Evaluation Patient Details Name: Xavier [name] MRN: [id number] DOB: [birth year] Today's Date: 12/09/2020   History of Present Illness  Pt is a 60 y/o male admitted secondary to bee stings and rhabdomyolysis. Also found to be + for cocaine and alcohol. PMH includes substance abuse, HTN, alcohol abuse, asthma, and HTN.  Clinical Impression  Patient evaluated by Physical Therapy with no further acute PT needs identified. All education has been completed and the patient has no further questions. Pt overall steady with gait and stair navigation this session. At a supervision to independent level for all mobility tasks. Reporting some stiffness and educated about walking program to perform at home. Pt reports he is close to baseline. See below for any follow-up Physical Therapy or equipment needs. PT is signing off. Thank you for this referral. If needs change, please re-consult.      Follow Up Recommendations No PT follow up    Equipment Recommendations  None recommended by PT    Recommendations for Other Services       Precautions / Restrictions Precautions Precautions: None Restrictions Weight Bearing Restrictions: No      Mobility  Bed Mobility Overal bed mobility: Independent                  Transfers Overall transfer level: Independent                  Ambulation/Gait Ambulation/Gait assistance: Modified independent (Device/Increase time) Gait Distance (Feet): 125 Feet Assistive device: None Gait Pattern/deviations: Step-through pattern Gait velocity: Mildly slower.   General Gait Details: Mildly slower gait speed. Reporting stiffness during ambulation. No overt LOB noted. Able to bend down and pick up objects on floor without LOB.  Stairs Stairs: Yes Stairs assistance: Supervision Stair Management: Alternating pattern;Forwards;No rails Number of Stairs: 6 General stair comments: Overall safe stair navigation. No LOB  noted.  Wheelchair Mobility    Modified Rankin (Stroke Patients Only)       Balance Overall balance assessment: No apparent balance deficits (not formally assessed)                                           Pertinent Vitals/Pain Pain Assessment: No/denies pain    Home Living Family/patient expects to be discharged to:: Private residence Living Arrangements: Other relatives Available Help at Discharge: Family Type of Home: House Home Access: Stairs to enter Entrance Stairs-Rails: None Entrance Stairs-Number of Steps: 7-8 Home Layout: Two level;Able to live on main level with bedroom/bathroom Home Equipment: None      Prior Function Level of Independence: Independent               Hand Dominance        Extremity/Trunk Assessment   Upper Extremity Assessment Upper Extremity Assessment: Overall WFL for tasks assessed    Lower Extremity Assessment Lower Extremity Assessment: Generalized weakness (reporting stiffness in BLE)    Cervical / Trunk Assessment Cervical / Trunk Assessment: Normal  Communication   Communication: No difficulties  Cognition Arousal/Alertness: Awake/alert Behavior During Therapy: Impulsive Overall Cognitive Status: No family/caregiver present to determine baseline cognitive functioning                                 General Comments: Mildly impulsive. Likely baseline      General Comments  Exercises     Assessment/Plan    PT Assessment Patent does not need any further PT services  PT Problem List         PT Treatment Interventions      PT Goals (Current goals can be found in the Care Plan section)  Acute Rehab PT Goals Patient Stated Goal: to go home PT Goal Formulation: With patient Time For Goal Achievement: 12/09/20 Potential to Achieve Goals: Good    Frequency     Barriers to discharge        Co-evaluation               AM-PAC PT "6 Clicks" Mobility  Outcome  Measure Help needed turning from your back to your side while in a flat bed without using bedrails?: None Help needed moving from lying on your back to sitting on the side of a flat bed without using bedrails?: None Help needed moving to and from a bed to a chair (including a wheelchair)?: None Help needed standing up from a chair using your arms (e.g., wheelchair or bedside chair)?: None Help needed to walk in hospital room?: None Help needed climbing 3-5 steps with a railing? : None 6 Click Score: 24    End of Session   Activity Tolerance: Patient tolerated treatment well Patient left: in bed;with call bell/phone within reach Nurse Communication: Mobility status PT Visit Diagnosis: Other abnormalities of gait and mobility (R26.89)    Time: 6010-9323 PT Time Calculation (min) (ACUTE ONLY): 13 min   Charges:   PT Evaluation $PT Eval Low Complexity: 1 Low          Cindee Salt, DPT  Acute Rehabilitation Services  Pager: [phone] Office: [phone]   Lehman Prom 12/09/2020, 11:53 AM

## 2020-12-09 NOTE — TOC Transition Note (Signed)
Transition of Care [facility]) - CM/SW Discharge Note   Patient Details  Name: Xavier [name] MRN: [id number] Date of Birth: [birth year]  Transition of Care [facility]) CM/SW Contact:  Epifanio Lesches, RN Phone Number: 12/09/2020, 1:41 PM   Clinical Narrative:    Admitted with Rhabdomyolysis,alcohol abuse, asthma, hypertension, depression. States visiting family in [place], from [place]. Pt jobless, limited income. No PCP.   Per MD patient ready for DC today. RN, patient, patient's family notified of DC. Pt states will have family / friend assist with Rx med cost. Post hospital f/u noted on AVS: [facility] [place] [id number] Steps: Go on 9/19/2022Appointment: Instructions: post hospital follow up scheduled for  01/17/2021 at 9:30 am with Gwinda Passe NP [facility] will sign off for now as intervention is no longer needed. Please consult [place] again if new needs arise.    Final next level of care: Home/Self Care Barriers to Discharge: No Barriers Identified   Patient Goals and CMS Choice        Discharge Placement                       Discharge Plan and Services                                     Social Determinants of Health (SDOH) Interventions     Readmission Risk Interventions No flowsheet data found.

## 2020-12-09 NOTE — Progress Notes (Signed)
1550 RN rounded on patient to check if daughter had come by to pick him up. Pt was not in the room and all belongings are gone.   Alona Bene, NT, saw the patient walking unassisted in the hall with belongings and offered to transport the patient downstairs. Pt declined, stating." I am ok. I don't need help".   13 RN received from pt's daughter that she was downstairs to pick up her dad. I informed her that pt had left the unit unassisted with belongings. Pt's daughter replied back, "Yeah. That sounds like him. I told him to give me until after four o'clock to come get him because I had an appointment. Thank you though for letting me know. I'll just go check by the ED to see if he is sitting in the bay". She ended the conversation by stating, "this is not unusual. Riley Nearing is just him".   MD updated and charge RN made aware.

## 2020-12-09 NOTE — Plan of Care (Deleted)
Discharge teaching was provided to pt at the bedside. Pt verbalized understanding of teaching and stated that he would follow up with his appointment on 09/18. Patient's d/c packet and prescription was placed in his belongings before they were returned to him. Patient is currently stable and waiting for his transportation home.   Problem: Education: Goal: Knowledge of General Education information will improve Description: Including pain rating scale, medication(s)/side effects and non-pharmacologic comfort measures 12/09/2020 1412 by Marsh Dolly, RN Outcome: Adequate for Discharge 12/09/2020 1159 by Marsh Dolly, RN Outcome: Progressing   Problem: Health Behavior/Discharge Planning: Goal: Ability to manage health-related needs will improve 12/09/2020 1412 by Marsh Dolly, RN Outcome: Adequate for Discharge 12/09/2020 1159 by Marsh Dolly, RN Outcome: Progressing   Problem: Clinical Measurements: Goal: Ability to maintain clinical measurements within normal limits will improve 12/09/2020 1412 by Marsh Dolly, RN Outcome: Adequate for Discharge 12/09/2020 1159 by Marsh Dolly, RN Outcome: Progressing Goal: Will remain free from infection Outcome: Adequate for Discharge Goal: Diagnostic test results will improve Outcome: Adequate for Discharge Goal: Respiratory complications will improve Outcome: Adequate for Discharge Goal: Cardiovascular complication will be avoided Outcome: Adequate for Discharge   Problem: Activity: Goal: Risk for activity intolerance will decrease 12/09/2020 1412 by Marsh Dolly, RN Outcome: Adequate for Discharge 12/09/2020 1159 by Marsh Dolly, RN Outcome: Progressing   Problem: Nutrition: Goal: Adequate nutrition will be maintained 12/09/2020 1412 by Marsh Dolly, RN Outcome: Adequate for Discharge 12/09/2020 1159 by Marsh Dolly, RN Outcome: Progressing   Problem: Coping: Goal: Level of anxiety will decrease 12/09/2020 1412  by Marsh Dolly, RN Outcome: Adequate for Discharge 12/09/2020 1159 by Marsh Dolly, RN Outcome: Progressing   Problem: Elimination: Goal: Will not experience complications related to bowel motility 12/09/2020 1412 by Marsh Dolly, RN Outcome: Adequate for Discharge 12/09/2020 1159 by Marsh Dolly, RN Outcome: Progressing Goal: Will not experience complications related to urinary retention 12/09/2020 1412 by Marsh Dolly, RN Outcome: Adequate for Discharge 12/09/2020 1159 by Marsh Dolly, RN Outcome: Progressing   Problem: Pain Managment: Goal: General experience of comfort will improve 12/09/2020 1412 by Marsh Dolly, RN Outcome: Adequate for Discharge 12/09/2020 1159 by Marsh Dolly, RN Outcome: Progressing   Problem: Safety: Goal: Ability to remain free from injury will improve 12/09/2020 1412 by Marsh Dolly, RN Outcome: Adequate for Discharge 12/09/2020 1159 by Marsh Dolly, RN Outcome: Progressing   Problem: Skin Integrity: Goal: Risk for impaired skin integrity will decrease Outcome: Adequate for Discharge

## 2020-12-09 NOTE — TOC Progression Note (Signed)
Transition of Care (TOC) - Progression Note    Patient Details  Name: Xavier [name] MRN: [id number] Date of Birth: 09/22/1960  Transition of Care (TOC) CM/SW Contact   F , LCSWA Phone Number: 12/09/2020, 9:59 AM  Clinical Narrative:     CSW received a consult for substance abuse counseling/ education.  CSW met with the patient.  The patient reported that he did not want any resources for SA as he had just completed rehab in [place].          Expected Discharge Plan and Services           Expected Discharge Date: 12/09/20                                     Social Determinants of Health (SDOH) Interventions    Readmission Risk Interventions No flowsheet data found.  

## 2020-12-09 NOTE — TOC CAGE-AID Note (Signed)
Transition of Care [facility]) - CAGE-AID Screening   Patient Details  Name: Xavier [name] MRN: [id number] Date of Birth: 04/23/1961  Transition of Care [facility]) CM/SW Contact:    Epifanio Lesches, RN Phone Number: 12/09/2020, 1:21 PM   Clinical Narrative:  Pt participated in assessment.Pt admitted to substance use. TOC- RN CM will provide educational resources attached to d/c summary.  CAGE-AID Screening:    Have You Ever Felt You Ought to Cut Down on Your Drinking or Drug Use?: Yes Have People Annoyed You By Critizing Your Drinking Or Drug Use?: No Have You Felt Bad Or Guilty About Your Drinking Or Drug Use?: No Have You Ever Had a Drink or Used Drugs First Thing In The Morning to Steady Your Nerves or to Get Rid of a Hangover?: Yes CAGE-AID Score: 2  Substance Abuse Education Offered: No  Substance abuse interventions: Transport planner

## 2020-12-09 NOTE — Progress Notes (Signed)
Discharge teaching was provided to pt at the bedside. Pt verbalized understanding of teaching and stated that he would follow up with his appointment on 09/18. Patient's d/c packet and prescription was placed in his belongings before they were returned to him. Patient is currently stable and waiting for his transportation home.    Pt has received his albuterol from the [facility]. Pt was then informed that he could call his ride as he was ready for d/c

## 2020-12-09 NOTE — Plan of Care (Signed)
  Problem: Education: Goal: Knowledge of General Education information will improve Description Including pain rating scale, medication(s)/side effects and non-pharmacologic comfort measures Outcome: Progressing   Problem: Health Behavior/Discharge Planning: Goal: Ability to manage health-related needs will improve Outcome: Progressing   Problem: Clinical Measurements: Goal: Ability to maintain clinical measurements within normal limits will improve Outcome: Progressing   Problem: Activity: Goal: Risk for activity intolerance will decrease Outcome: Progressing   Problem: Nutrition: Goal: Adequate nutrition will be maintained Outcome: Progressing   Problem: Coping: Goal: Level of anxiety will decrease Outcome: Progressing   Problem: Elimination: Goal: Will not experience complications related to bowel motility Outcome: Progressing Goal: Will not experience complications related to urinary retention Outcome: Progressing   Problem: Pain Managment: Goal: General experience of comfort will improve Outcome: Progressing   Problem: Safety: Goal: Ability to remain free from injury will improve Outcome: Progressing   

## 2020-12-09 NOTE — Progress Notes (Signed)
OT Cancellation Note  Patient Details Name: Xavier [name] MRN: [id number] DOB: 08/14/1960   Cancelled Treatment:    Reason Eval/Treat Not Completed: OT screened, no needs identified, will sign off. PT met with pt and reported that he was at his baseline with all mobility and ADL's. Acute OT will sign off at this time, please re-consult if needs change.   Carvin Almas H., OTR/L Acute Rehabilitation  Rosita Guzzetta Elane Yolanda Bonine 12/09/2020, 10:06 AM

## 2020-12-09 NOTE — Discharge Summary (Signed)
Physician Discharge Summary  Xavier [name]:[id number] DOB: Nov 29, 1960 DOA: 12/07/2020  PCP: Pcp, No  Admit date: 12/07/2020 Discharge date: 12/09/2020  Admitted From: Home Disposition: Home  Recommendations for Outpatient Follow-up:  Follow up with PCP in 1-2 weeks Please obtain CMP/CBC, Mag, Phos in one week Please follow up on the following pending results:  Home Health: No  Equipment/Devices: None    Discharge Condition: Stable CODE STATUS: FULL CODE  Diet recommendation: Heart Healthy Diet   Brief/Interim Summary: The patient is a 60 year old African-American male with a past medical history significant for but not limited to alcohol abuse, asthma, hypertension, depression as well as other comorbidities who presented the ED complaining of being stung by a bee.  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  He been working in the yard for most of the day try to drink some fluids but not adequately.  He started experiencing worsening leg cramping and was stung at least twice by a bee.  He remembers being stung in the nose and the back of the head.  EMS was called and they arrived he was diaphoretic as well as weak.  He appeared very dehydrated there is no evidence of any anaphylactic reaction and he did not get treated for that.  He was brought to the ED and was noted to have an AKI as well as elevated CPK.  Additionally he endorsed taking cocaine yesterday as well as drinking a number of beers.  Patient was admitted with mild rhabdomyolysis and prerenal azotemia which was complicated by alcohol abuse and cocaine abuse. He was given IVF hydration and improved and CK normalized and AKI resolved. TOC was consulted for substance abuse resources. PT evaluated and recommended no home Health. He is medically stable to D/C Home and follow up with PCP within 1-2 weeks.   Discharge Diagnoses:  Principal Problem:   Rhabdomyolysis Active Problems:   HTN (hypertension)    Alcoholism (HCC)   AKI (acute kidney injury) (HCC)  Rhabdomyolysis, improved  -Mild. 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 day and having inadequate Fluid Hydration -C/w IVF Hydration but Reduce the Rate from D5 in LR from 250 mL/hr to 125 mL/hr -Continue to Monitor and trend and repeat CK as an out patient if necessary    AKI, improved  -Likely Prerenal in [place] -Improving as Patient's BUN/Cr went from 13/1.77 -> 17/1.17 -> 10/1.07 -C/w IVF Hydration as above -Avoid Nephrotoxic Medications, Contrast Dyes, Hypotension and Renally adjust medications -Continue to Monitor and Trend Renal Fxn Carefully and repeat CMP in the AM   Elevated Anion Gap Metabolic Acidosis -Mild and Improved now -On Admission patient had a CO2 of 18, AG of 18, and a Chloride Level of 105 -Now CO2 is 26, AG is 3, and Chloride Level is 110 -Continue to Monitor and Trend and Repeat CMP in the AM   Abnormal LFT's, improving  -In the setting of Alcoholism/Alcohol Abuse -Patient's AST went from 129 -> 79 -> 58 and ALT went from 110 -> 81 -> 66 -Continue to Monitor and Trend and Avoid Hepatotoxic Medications -Repeat CMP in the AM   Alcoholism/Alcohol Abuse with Concern for Withdrawal -CIWA Protocol Initiated with IV Lorazepam -Had been drinking quite a bit of Beers the day prior to admission -C/w Folic Acid, MVI+Minerals, and Thiamine Supplementation at D/C -  CIWA's have been consistently <5    Polysubstance Abuse -Admitted to doing Cocaine the day Prior to Admission  -Counseling given -TOC consulted for Resources    Essential Hypertension -BP was 119/85 on last check  -Continue to Monitor BP per Protocol -If necessary will place on IV Hydrlazine prn   Asthma -C/w Albuterol Inhaler 2 puff q6hprn  -Currently not in Exacerbation    Depression -C/w Home Seroquel 150 mg po BID    Hyperglycemia in the  setting of Pre-Diabetes  -Patient's Blood Sugar on Admission was 102 on CMP and now 118 this AM -Checked HbA1c and was 6.0 -Continue to Monitor and Trend Blood Sugars carefully as patient is on D5 in LR at 125 mL/hr and if Necessary will place on Sensitive Novolog SSI AC -Dietary Counseling given   Discharge Instructions  Discharge Instructions     Call MD for:  difficulty breathing, headache or visual disturbances   Complete by: As directed    Call MD for:  extreme fatigue   Complete by: As directed    Call MD for:  hives   Complete by: As directed    Call MD for:  persistant dizziness or light-headedness   Complete by: As directed    Call MD for:  persistant nausea and vomiting   Complete by: As directed    Call MD for:  redness, tenderness, or signs of infection (pain, swelling, redness, odor or green/yellow discharge around incision site)   Complete by: As directed    Call MD for:  severe uncontrolled pain   Complete by: As directed    Call MD for:  temperature >100.4   Complete by: As directed    Diet - low sodium heart healthy   Complete by: As directed    Diet Carb Modified   Complete by: As directed    Discharge instructions   Complete by: As directed    You were cared for by a hospitalist during your hospital stay. If you have any questions about your discharge medications or the care you received while you were in the hospital after you are discharged, you can call the unit and ask to speak with the hospitalist on call if the hospitalist that took care of you is not available. Once you are discharged, your primary care physician will handle any further medical issues. Please note that NO REFILLS for any discharge medications will be authorized once you are discharged, as it is imperative that you return to your primary care physician (or establish a relationship with a primary care physician if you do not have one) for your aftercare needs so that they can reassess your need  for medications and monitor your lab values.  Follow up with PCP within 1 week. Take all medications as prescribed. If symptoms change or worsen please return to the ED for evaluation   Increase activity slowly   Complete by: As directed       Allergies as of 12/09/2020       Reactions   Nsaids Rash        Medication List     STOP taking these medications    oxyCODONE-acetaminophen 5-325 MG tablet Commonly known as: PERCOCET/ROXICET       TAKE these medications    albuterol 108 (90 Base) MCG/ACT inhaler Commonly known as: VENTOLIN HFA Inhale 2 puffs into the lungs every 6 (six) hours as needed for wheezing.   folic acid 1 MG tablet Commonly known as: FOLVITE Take 1  tablet (1 mg total) by mouth daily. Start taking on: December 10, 2020   multivitamin with minerals Tabs tablet Take 1 tablet by mouth daily. Start taking on: December 10, 2020   QUEtiapine 50 MG tablet Commonly known as: SEROQUEL Take 3 tablets (150 mg total) by mouth 2 (two) times daily.   thiamine 100 MG tablet Take 1 tablet (100 mg total) by mouth daily. Start taking on: December 10, 2020        Allergies  Allergen Reactions   Nsaids Rash    Consultations: None  Procedures/Studies: No results found.  Subjective: Seen and examined and was doing fairly well. No CP or SOB. Leg weakness and cramping improved. No Lightheadedness or dizziness. Ambulated well with PT but was a little impulsive. No concerns and back to his baseline and will follow up with PCP within 1-2 weeks.   Discharge Exam: Vitals:   12/08/20 2200 12/09/20 0750  BP: 134/76 119/85  Pulse: 72 70  Resp: 17 16  Temp: 98.1 F (36.7 C) 97.7 F (36.5 C)  SpO2:  100%   Vitals:   12/08/20 0812 12/08/20 1645 12/08/20 2200 12/09/20 0750  BP: (!) 137/91 136/82 134/76 119/85  Pulse: 77 82 72 70  Resp: 20 16 17 16   Temp: 98.1 F (36.7 C) 98.1 F (36.7 C) 98.1 F (36.7 C) 97.7 F (36.5 C)  TempSrc: Oral Oral Oral   SpO2:  100% 98%  100%  Weight:      Height:       General: Pt is alert, awake, not in acute distress Cardiovascular: RRR, S1/S2 +, no rubs, no gallops Respiratory: Diminished bilaterally, no wheezing, no rhonchi; Unlabored breathing  Abdominal: Soft, NT, ND, bowel sounds + Extremities: no edema, no cyanosis  The results of significant diagnostics from this hospitalization (including imaging, microbiology, ancillary and laboratory) are listed below for reference.    Microbiology: Recent Results (from the past 240 hour(s))  Resp Panel by RT-PCR (Flu A&B, Covid) Nasopharyngeal Swab     Status: None   Collection Time: 12/07/20 11:48 PM   Specimen: Nasopharyngeal Swab; Nasopharyngeal(NP) swabs in vial transport medium  Result Value Ref Range Status   SARS Coronavirus 2 by RT PCR NEGATIVE NEGATIVE Final    Comment: (NOTE) SARS-CoV-2 target nucleic acids are NOT DETECTED.  The SARS-CoV-2 RNA is generally detectable in upper respiratory specimens during the acute phase of infection. The lowest concentration of SARS-CoV-2 viral copies this assay can detect is 138 copies/mL. A negative result does not preclude SARS-Cov-2 infection and should not be used as the sole basis for treatment or other patient management decisions. A negative result may occur with  improper specimen collection/handling, submission of specimen other than nasopharyngeal swab, presence of viral mutation(s) within the areas targeted by this assay, and inadequate number of viral copies(<138 copies/mL). A negative result must be combined with clinical observations, patient history, and epidemiological information. The expected result is Negative.  Fact Sheet for Patients:  02/06/21  Fact Sheet for Healthcare Providers:  BloggerCourse.com  This test is no t yet approved or cleared by the [place] FDA and  has been authorized for detection and/or diagnosis of  SARS-CoV-2 by FDA under an Emergency Use Authorization (EUA). This EUA will remain  in effect (meaning this test can be used) for the duration of the COVID-19 declaration under Section 564(b)(1) of the Act, 21 U.S.C.section 360bbb-3(b)(1), unless the authorization is terminated  or revoked sooner.       Influenza A  by PCR NEGATIVE NEGATIVE Final   Influenza B by PCR NEGATIVE NEGATIVE Final    Comment: (NOTE) The Xpert Xpress SARS-CoV-2/FLU/RSV plus assay is intended as an aid in the diagnosis of influenza from Nasopharyngeal swab specimens and should not be used as a sole basis for treatment. Nasal washings and aspirates are unacceptable for Xpert Xpress SARS-CoV-2/FLU/RSV testing.  Fact Sheet for Patients: BloggerCourse.com  Fact Sheet for Healthcare Providers: SeriousBroker.it  This test is not yet approved or cleared by the [place] FDA and has been authorized for detection and/or diagnosis of SARS-CoV-2 by FDA under an Emergency Use Authorization (EUA). This EUA will remain in effect (meaning this test can be used) for the duration of the COVID-19 declaration under Section 564(b)(1) of the Act, 21 U.S.C. section 360bbb-3(b)(1), unless the authorization is terminated or revoked.  Performed at [facility], [address], [place], [place] [address]     Labs: BNP (last 3 results) No results for input(s): BNP in the last 8760 hours. Basic Metabolic Panel: Recent Labs  Lab 12/07/20 1156 12/07/20 2345 12/08/20 0511 12/09/20 0252  NA 141  --  138 139  K 3.7  --  3.8 4.1  CL 105  --  110 110  CO2 18*  --  22 26  GLUCOSE 102*  --  118* 116*  BUN 13  --  17 10  CREATININE 1.77* 1.21 1.17 1.07  CALCIUM 9.9  --  8.4* 8.4*  MG 2.4 2.2  --  1.7  PHOS  --  4.1  --  3.7   Liver Function Tests: Recent Labs  Lab 12/07/20 1156 12/08/20 0511 12/09/20 0252  AST 129* 79* 58*  ALT 110* 81* 66*  ALKPHOS 47 48 39   BILITOT 0.6 0.7 0.6  PROT 7.4 5.7* 4.8*  ALBUMIN 4.2 3.1* 2.5*   No results for input(s): LIPASE, AMYLASE in the last 168 hours. No results for input(s): AMMONIA in the last 168 hours. CBC: Recent Labs  Lab 12/07/20 1156 12/08/20 0511 12/09/20 0252  WBC 8.8 8.1 6.5  NEUTROABS 5.5  --  2.4  HGB 16.0 14.1 13.1  HCT 50.0 41.1 39.1  MCV 99.2 94.1 94.7  PLT 178 178 163   Cardiac Enzymes: Recent Labs  Lab 12/07/20 1156 12/08/20 0511 12/09/20 0252  CKTOTAL 1,061* 751* 385   BNP: Invalid input(s): POCBNP CBG: No results for input(s): GLUCAP in the last 168 hours. D-Dimer No results for input(s): DDIMER in the last 72 hours. Hgb A1c Recent Labs    12/09/20 0311  HGBA1C 6.0*   Lipid Profile No results for input(s): CHOL, HDL, LDLCALC, TRIG, CHOLHDL, LDLDIRECT in the last 72 hours. Thyroid function studies No results for input(s): TSH, T4TOTAL, T3FREE, THYROIDAB in the last 72 hours.  Invalid input(s): FREET3 Anemia work up No results for input(s): VITAMINB12, FOLATE, FERRITIN, TIBC, IRON, RETICCTPCT in the last 72 hours. Urinalysis    Component Value Date/Time   COLORURINE AMBER (A) 12/08/2020 0223   APPEARANCEUR HAZY (A) 12/08/2020 0223   LABSPEC 1.026 12/08/2020 0223   PHURINE 5.0 12/08/2020 0223   GLUCOSEU NEGATIVE 12/08/2020 0223   HGBUR NEGATIVE 12/08/2020 0223   BILIRUBINUR NEGATIVE 12/08/2020 0223   KETONESUR NEGATIVE 12/08/2020 0223   PROTEINUR 30 (A) 12/08/2020 0223   UROBILINOGEN 0.2 12/10/2012 1735   NITRITE NEGATIVE 12/08/2020 0223   LEUKOCYTESUR SMALL (A) 12/08/2020 0223   Sepsis Labs Invalid input(s): PROCALCITONIN,  WBC,  LACTICIDVEN Microbiology Recent Results (from the past 240 hour(s))  Resp Panel by RT-PCR (Flu A&B, Covid) Nasopharyngeal Swab     Status: None   Collection Time: 12/07/20 11:48 PM   Specimen: Nasopharyngeal Swab; Nasopharyngeal(NP) swabs in vial transport medium  Result Value Ref Range Status   SARS Coronavirus 2 by RT  PCR NEGATIVE NEGATIVE Final    Comment: (NOTE) SARS-CoV-2 target nucleic acids are NOT DETECTED.  The SARS-CoV-2 RNA is generally detectable in upper respiratory specimens during the acute phase of infection. The lowest concentration of SARS-CoV-2 viral copies this assay can detect is 138 copies/mL. A negative result does not preclude SARS-Cov-2 infection and should not be used as the sole basis for treatment or other patient management decisions. A negative result may occur with  improper specimen collection/handling, submission of specimen other than nasopharyngeal swab, presence of viral mutation(s) within the areas targeted by this assay, and inadequate number of viral copies(<138 copies/mL). A negative result must be combined with clinical observations, patient history, and epidemiological information. The expected result is Negative.  Fact Sheet for Patients:  BloggerCourse.com  Fact Sheet for Healthcare Providers:  SeriousBroker.it  This test is no t yet approved or cleared by the [place] FDA and  has been authorized for detection and/or diagnosis of SARS-CoV-2 by FDA under an Emergency Use Authorization (EUA). This EUA will remain  in effect (meaning this test can be used) for the duration of the COVID-19 declaration under Section 564(b)(1) of the Act, 21 U.S.C.section 360bbb-3(b)(1), unless the authorization is terminated  or revoked sooner.       Influenza A by PCR NEGATIVE NEGATIVE Final   Influenza B by PCR NEGATIVE NEGATIVE Final    Comment: (NOTE) The Xpert Xpress SARS-CoV-2/FLU/RSV plus assay is intended as an aid in the diagnosis of influenza from Nasopharyngeal swab specimens and should not be used as a sole basis for treatment. Nasal washings and aspirates are unacceptable for Xpert Xpress SARS-CoV-2/FLU/RSV testing.  Fact Sheet for Patients: BloggerCourse.com  Fact Sheet for  Healthcare Providers: SeriousBroker.it  This test is not yet approved or cleared by the [place] FDA and has been authorized for detection and/or diagnosis of SARS-CoV-2 by FDA under an Emergency Use Authorization (EUA). This EUA will remain in effect (meaning this test can be used) for the duration of the COVID-19 declaration under Section 564(b)(1) of the Act, 21 U.S.C. section 360bbb-3(b)(1), unless the authorization is terminated or revoked.  Performed at [facility], [address], [place], [place] [address]    Time coordinating discharge: 35 minutes  SIGNED:  Merlene Laughter, DO [facility] 12/09/2020, 9:56 AM Pager is on AMION  If 7PM-7AM, please contact night-coverage [email or web address]

## 2021-01-17 ENCOUNTER — Inpatient Hospital Stay (INDEPENDENT_AMBULATORY_CARE_PROVIDER_SITE_OTHER): Payer: Medicaid - Out of State | Admitting: Primary Care

## 2021-08-10 ENCOUNTER — Other Ambulatory Visit: Payer: Self-pay

## 2021-08-10 ENCOUNTER — Emergency Department (HOSPITAL_COMMUNITY)
Admission: EM | Admit: 2021-08-10 | Discharge: 2021-08-11 | Disposition: A | Discharge status: Home / Self Care | Payer: Medicaid - Out of State | Attending: Emergency Medicine | Admitting: Emergency Medicine

## 2021-08-10 DIAGNOSIS — M79644 Pain in right finger(s): Secondary | ICD-10-CM | POA: Insufficient documentation

## 2021-08-10 MED ORDER — ACETAMINOPHEN 325 MG PO TABS: 650.0000 mg | ORAL_TABLET | Freq: Four times a day (QID) | ORAL | 0 refills | Status: AC | PRN

## 2021-08-10 MED ORDER — ALBUTEROL SULFATE HFA 108 (90 BASE) MCG/ACT IN AERS: 2.0000 | INHALATION_SPRAY | Freq: Four times a day (QID) | RESPIRATORY_TRACT | 0 refills | Status: AC | PRN

## 2021-08-10 MED ORDER — ACETAMINOPHEN 325 MG PO TABS
650.0000 mg | ORAL_TABLET | Freq: Once | ORAL | Status: AC
  Administered 2021-08-10: 650 mg via ORAL
  Filled 2021-08-10: qty 2

## 2021-08-10 MED ORDER — CEPHALEXIN 500 MG PO CAPS: 500.0000 mg | ORAL_CAPSULE | Freq: Four times a day (QID) | ORAL | 0 refills | Status: AC

## 2021-08-10 MED ORDER — CEPHALEXIN 250 MG PO CAPS
500.0000 mg | ORAL_CAPSULE | Freq: Once | ORAL | Status: AC
  Administered 2021-08-10: 500 mg via ORAL
  Filled 2021-08-10: qty 2

## 2021-08-10 NOTE — Discharge Instructions (Signed)
Take the medications as prescribed.  ? ?Return with worsening pain, redness, swelling.  ? ?Follow-up with a primary care doctor if you have any long tern concerns regarding your finger pain.  ?

## 2021-08-10 NOTE — ED Provider Notes (Signed)
?[facility] EMERGENCY DEPARTMENT ?Provider Note ? ? ?CSN: [id number] ?Arrival date & time: 08/10/21  0949 ? ?  ? ?History ? ?Chief Complaint  ?Patient presents with  ? Hand Pain  ? ? ?Xavier [name] is a 61 y.o. male. ? ?Pt presents with c/o right long and ring finger pain. He initially injured these fingers in 11/2014 when he cut them with a lawn mower blade. X-rays demonstrated tuft fracture at that time. States worsening pain in the same areas over the past 1-2 weeks. States finger tips look a little swollen and are purple. No fever. Pain shoots into arm. Worse with movement.  ? ? ?  ? ?Home Medications ?Prior to Admission medications   ?Medication Sig Start Date End Date Taking? Authorizing Provider  ?acetaminophen (TYLENOL) 325 MG tablet Take 2 tablets (650 mg total) by mouth every 6 (six) hours as needed. 08/10/21  Yes Renne Crigler, PA-C  ?cephALEXin (KEFLEX) 500 MG capsule Take 1 capsule (500 mg total) by mouth 4 (four) times daily. 08/10/21  Yes Renne Crigler, PA-C  ?albuterol (VENTOLIN HFA) 108 (90 Base) MCG/ACT inhaler Inhale 2 puffs into the lungs every 6 (six) hours as needed for wheezing. 08/10/21   Renne Crigler, PA-C  ?folic acid (FOLVITE) 1 MG tablet Take 1 tablet (1 mg total) by mouth daily. 12/10/20   Merlene Laughter, DO  ?Multiple Vitamin (MULTIVITAMIN WITH MINERALS) TABS tablet Take 1 tablet by mouth daily. 12/10/20   Merlene Laughter, DO  ?QUEtiapine (SEROQUEL) 50 MG tablet Take 3 tablets (150 mg total) by mouth 2 (two) times daily. 04/05/15   Arthor Captain, PA-C  ?thiamine 100 MG tablet Take 1 tablet (100 mg total) by mouth daily. 12/10/20   Merlene Laughter, DO  ?   ? ?Allergies    ?Nsaids   ? ?Review of Systems   ?Review of Systems ? ?Physical Exam ?Updated Vital Signs ?BP (!) 151/99 (BP Location: Left Arm)   Pulse 72   Temp 98.1 ?F (36.7 ?C)   Resp 17   SpO2 100%  ?Physical Exam ?Vitals and nursing note reviewed.  ?Constitutional:   ?   Appearance: He is  well-developed.  ?HENT:  ?   Head: Normocephalic and atraumatic.  ?Eyes:  ?   Conjunctiva/sclera: Conjunctivae normal.  ?Cardiovascular:  ?   Pulses: Normal pulses. No decreased pulses.  ?Musculoskeletal:     ?   General: Tenderness present.  ?   Cervical back: Normal range of motion and neck supple.  ?   Right lower leg: No edema.  ?   Left lower leg: No edema.  ?   Comments: Dry cracked skin to the fingertips of R middle and long finger. Trace swelling. No obvious paronychia/eponychia or felon. Tender to palpation. He can flex and extend, although reports pain with flexion. Hand and wrist are normal.   ?Skin: ?   General: Skin is warm and dry.  ?Neurological:  ?   Mental Status: He is alert.  ?   Sensory: No sensory deficit.  ?   Comments: Motor, sensation, and vascular distal to the injury is fully intact.   ?Psychiatric:     ?   Mood and Affect: Mood normal.  ? ? ?ED Results / Procedures / Treatments   ?Labs ?(all labs ordered are listed, but only abnormal results are displayed) ?Labs Reviewed - No data to display ? ?EKG ?None ? ?Radiology ?No results found. ? ?Procedures ?Procedures  ? ? ?Medications Ordered in  ED ?Medications  ?cephALEXin (KEFLEX) capsule 500 mg (has no administration in time range)  ?acetaminophen (TYLENOL) tablet 650 mg (has no administration in time range)  ? ? ?ED Course/ Medical Decision Making/ A&P ? Patient seen and examined. History obtained directly from patient. Work-up including labs, imaging, EKG ordered in triage, if performed, were reviewed.   ? ?Medications/Fluids: Ordered: Keflex and Tylenol. NSAID allergic.   ? ?Most recent vital signs reviewed and are as follows: ?BP (!) 151/99 (BP Location: Left Arm)   Pulse 72   Temp 98.1 ?F (36.7 ?C)   Resp 17   SpO2 100%  ? ?Initial impression: finger pain ? ?Home treatment plan: [facility] protocol ? ?Return instructions discussed with patient: Pt urged to return with worsening pain, worsening swelling, expanding area of redness or  streaking up extremity, fever, or any other concerns.  Urged to take complete course of antibiotics as prescribed.  Pt verbalizes understanding and agrees with plan. ? ?Follow-up instructions discussed with patient: PCP 1 week, referral given for [facility].  ? ?                        ?Medical Decision Making ?Risk ?OTC drugs. ?Prescription drug management. ? ? ?Finger pain, mild swelling. No nail bed infection or felon at this point. Cover with keflex, pain treatment with Tylenol. Encouraged PCP follow-up. Initial injury occurred in 2016, no new injuries.  ? ? ? ? ? ? ? ?Final Clinical Impression(s) / ED Diagnoses ?Final diagnoses:  ?Pain of finger of right hand  ? ? ?Rx / DC Orders ?ED Discharge Orders   ? ?      Ordered  ?  cephALEXin (KEFLEX) 500 MG capsule  4 times daily       ? 08/10/21 1100  ?  acetaminophen (TYLENOL) 325 MG tablet  Every 6 hours PRN       ? 08/10/21 1100  ?  albuterol (VENTOLIN HFA) 108 (90 Base) MCG/ACT inhaler  Every 6 hours PRN       ? 08/10/21 1100  ? ?  ?  ? ?  ? ? ?  ?Renne Crigler, PA-C ?08/10/21 1107 ? ?  ?Terrilee Files, MD ?08/10/21 1828 ? ?
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
## 2022-12-31 ENCOUNTER — Emergency Department (HOSPITAL_COMMUNITY): Payer: Medicaid Other

## 2022-12-31 ENCOUNTER — Emergency Department (HOSPITAL_COMMUNITY)
Admission: EM | Admit: 2022-12-31 | Discharge: 2022-12-31 | Disposition: A | Discharge status: Home / Self Care | Payer: Medicaid Other | Attending: Emergency Medicine | Admitting: Emergency Medicine

## 2022-12-31 ENCOUNTER — Other Ambulatory Visit: Payer: Self-pay

## 2022-12-31 ENCOUNTER — Encounter (HOSPITAL_COMMUNITY): Payer: Self-pay

## 2022-12-31 DIAGNOSIS — R0789 Other chest pain: Secondary | ICD-10-CM | POA: Diagnosis not present

## 2022-12-31 DIAGNOSIS — R110 Nausea: Secondary | ICD-10-CM | POA: Diagnosis not present

## 2022-12-31 DIAGNOSIS — R079 Chest pain, unspecified: Secondary | ICD-10-CM | POA: Diagnosis present

## 2022-12-31 LAB — CBC
HCT: 47.7 % (ref 39.0–52.0)
Hemoglobin: 15.9 g/dL (ref 13.0–17.0)
MCH: 29.8 pg (ref 26.0–34.0)
MCHC: 33.3 g/dL (ref 30.0–36.0)
MCV: 89.5 fL (ref 80.0–100.0)
Platelets: 196 10*3/uL (ref 150–400)
RBC: 5.33 MIL/uL (ref 4.22–5.81)
RDW: 13.2 % (ref 11.5–15.5)
WBC: 13.8 10*3/uL — ABNORMAL HIGH (ref 4.0–10.5)
nRBC: 0 % (ref 0.0–0.2)

## 2022-12-31 LAB — BASIC METABOLIC PANEL
Anion gap: 15 (ref 5–15)
BUN: 16 mg/dL (ref 6–20)
CO2: 19 mmol/L — ABNORMAL LOW (ref 22–32)
Calcium: 9.2 mg/dL (ref 8.9–10.3)
Chloride: 98 mmol/L (ref 98–111)
Creatinine, Ser: 0.95 mg/dL (ref 0.61–1.24)
GFR, Estimated: >60 mL/min (ref >60)
Glucose, Bld: 93 mg/dL (ref 70–99)
Potassium: 3.9 mmol/L (ref 3.5–5.1)
Sodium: 132 mmol/L — ABNORMAL LOW (ref 135–145)

## 2022-12-31 LAB — TROPONIN I (HIGH SENSITIVITY)
Troponin I (High Sensitivity): 9 ng/L (ref <18)
Troponin I (High Sensitivity): 10 ng/L (ref <18)

## 2022-12-31 LAB — LIPASE, BLOOD: Lipase: 22 U/L (ref 11–51)

## 2022-12-31 MED ORDER — IOHEXOL 350 MG/ML SOLN
50.0000 mL | Freq: Once | INTRAVENOUS | Status: AC | PRN
Start: 2022-12-31 — End: 2022-12-31
  Administered 2022-12-31: 50 mL via INTRAVENOUS

## 2022-12-31 MED ORDER — ASPIRIN 81 MG PO CHEW
324.0000 mg | CHEWABLE_TABLET | Freq: Once | ORAL | Status: DC
Start: 2022-12-31 — End: 2022-12-31

## 2022-12-31 MED ORDER — ACETAMINOPHEN 500 MG PO TABS
1000.0000 mg | ORAL_TABLET | Freq: Once | ORAL | Status: AC
  Administered 2022-12-31: 1000 mg via ORAL
  Filled 2022-12-31: qty 2

## 2022-12-31 MED ORDER — NITROGLYCERIN 0.4 MG SL SUBL
0.4000 mg | SUBLINGUAL_TABLET | SUBLINGUAL | Status: DC | PRN
  Administered 2022-12-31 (×3): 0.4 mg via SUBLINGUAL
  Filled 2022-12-31 (×3): qty 1
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
## 2022-12-31 NOTE — ED Notes (Addendum)
Pt ambulated to restroom without incident.  Pt denies hx tremors or seizures

## 2022-12-31 NOTE — Discharge Instructions (Signed)
Your testing that was done in the emergency department was normal.  Please follow-up with your primary care doctor.  Continue taking all medications as prescribed.  Return to the emergency department he develop new pain in your chest, worsening pain in your chest or difficulty breathing.

## 2022-12-31 NOTE — ED Notes (Signed)
Pt states he is hungry and still in pain. RN provided pt with graham crackers and beverage.

## 2022-12-31 NOTE — ED Notes (Signed)
Pt says he drinks 2-3 40ounce beers a day

## 2022-12-31 NOTE — ED Triage Notes (Signed)
[facility]/o chest pain that has been constant for 5-6 hours. Pt said he had an alcohol beverage 24-hours ago. Pt was given 324 mg aspirin.   BP 148/90

## 2023-06-03 ENCOUNTER — Emergency Department (HOSPITAL_COMMUNITY): Payer: Self-pay

## 2023-06-03 ENCOUNTER — Other Ambulatory Visit: Payer: Self-pay

## 2023-06-03 ENCOUNTER — Emergency Department (HOSPITAL_COMMUNITY)
Admission: EM | Admit: 2023-06-03 | Discharge: 2023-06-03 | Disposition: A | Discharge status: Home / Self Care | Payer: Self-pay | Attending: Emergency Medicine | Admitting: Emergency Medicine

## 2023-06-03 DIAGNOSIS — M79632 Pain in left forearm: Secondary | ICD-10-CM | POA: Diagnosis present

## 2023-06-03 DIAGNOSIS — S52225A Nondisplaced transverse fracture of shaft of left ulna, initial encounter for closed fracture: Secondary | ICD-10-CM | POA: Diagnosis not present

## 2023-06-03 MED ORDER — MORPHINE SULFATE (PF) 2 MG/ML IV SOLN
4.0000 mg | Freq: Once | INTRAVENOUS | Status: AC
  Administered 2023-06-03: 4 mg via INTRAMUSCULAR
  Filled 2023-06-03: qty 2

## 2023-06-03 MED ORDER — MORPHINE SULFATE (PF) 4 MG/ML IV SOLN
4.0000 mg | Freq: Once | INTRAVENOUS | Status: AC
  Administered 2023-06-03: 4 mg via INTRAMUSCULAR
  Filled 2023-06-03: qty 1

## 2023-06-03 MED ORDER — HYDROCODONE-ACETAMINOPHEN 5-325 MG PO TABS: 1.0000 | ORAL_TABLET | Freq: Four times a day (QID) | ORAL | 0 refills | Status: AC | PRN
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
## 2023-06-03 NOTE — ED Notes (Signed)
Ortho Tech came & splinted patient L arm; patient is noted to be in sling.

## 2023-06-03 NOTE — Progress Notes (Signed)
Orthopedic Tech Progress Note Patient Details:  Xavier [name] 16-Oct-1960 [id number]  Well-padded long arm splint and sling placed to LUE per order from Dr. Jeraldine Loots. Ice and elevation were encouraged to help with pain/swelling. Pt denies any tightness or discomfort from the splint. Sensation and motion of his digits remain intact.  Ortho Devices Type of Ortho Device: Post (long arm) splint, Arm sling Ortho Device/Splint Location: LUE Ortho Device/Splint Interventions: Ordered, Application, Adjustment   Post Interventions Patient Tolerated: Fair Instructions Provided: Care of device, Adjustment of device  Raechelle Sarti Carmine Savoy 06/03/2023, 7:11 PM
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
## 2023-10-11 ENCOUNTER — Ambulatory Visit: Admitting: Family Medicine

## 2023-10-11 ENCOUNTER — Telehealth: Payer: Self-pay

## 2023-10-11 NOTE — Telephone Encounter (Signed)
 Called pt and left vm to call office back to reschedule missed NP appt at [facility]
